# Patient Record
Sex: Male | Born: 1970
Health system: Southern US, Community
[De-identification: ages and names within clinical notes are randomized; demographics above are authoritative.]

## PROBLEM LIST (undated history)

## (undated) DIAGNOSIS — R079 Chest pain, unspecified: Secondary | ICD-10-CM

## (undated) DIAGNOSIS — E559 Vitamin D deficiency, unspecified: Secondary | ICD-10-CM

## (undated) DIAGNOSIS — M25549 Pain in joints of unspecified hand: Secondary | ICD-10-CM

## (undated) DIAGNOSIS — E7800 Pure hypercholesterolemia, unspecified: Secondary | ICD-10-CM

## (undated) DIAGNOSIS — K219 Gastro-esophageal reflux disease without esophagitis: Secondary | ICD-10-CM

## (undated) DIAGNOSIS — N200 Calculus of kidney: Secondary | ICD-10-CM

## (undated) DIAGNOSIS — I10 Essential (primary) hypertension: Secondary | ICD-10-CM

## (undated) HISTORY — DX: Essential (primary) hypertension: I10

## (undated) HISTORY — DX: Chest pain, unspecified: R07.9

## (undated) HISTORY — DX: Pure hypercholesterolemia, unspecified: E78.00

## (undated) HISTORY — DX: Calculus of kidney: N20.0

## (undated) HISTORY — DX: Pain in joints of unspecified hand: M25.549

## (undated) HISTORY — DX: Gastro-esophageal reflux disease without esophagitis: K21.9

## (undated) HISTORY — PX: TONSILLECTOMY: SUR1361

---
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
## 2005-12-31 ENCOUNTER — Emergency Department (HOSPITAL_COMMUNITY): Admission: EM | Admit: 2005-12-31 | Discharge: 2005-12-31 | Payer: Self-pay | Admitting: *Deleted

## 2006-04-09 ENCOUNTER — Emergency Department (HOSPITAL_COMMUNITY): Admission: EM | Admit: 2006-04-09 | Discharge: 2006-04-09 | Payer: Self-pay | Admitting: Emergency Medicine

## 2006-12-10 ENCOUNTER — Emergency Department (HOSPITAL_COMMUNITY): Admission: EM | Admit: 2006-12-10 | Discharge: 2006-12-10 | Payer: Self-pay | Admitting: Emergency Medicine

## 2007-07-22 ENCOUNTER — Emergency Department (HOSPITAL_COMMUNITY): Admission: EM | Admit: 2007-07-22 | Discharge: 2007-07-22 | Payer: Self-pay | Admitting: Emergency Medicine

## 2008-01-26 ENCOUNTER — Ambulatory Visit (HOSPITAL_BASED_OUTPATIENT_CLINIC_OR_DEPARTMENT_OTHER): Admission: RE | Admit: 2008-01-26 | Discharge: 2008-01-26 | Payer: Self-pay | Admitting: Urology

## 2009-01-01 ENCOUNTER — Emergency Department (HOSPITAL_COMMUNITY): Admission: EM | Admit: 2009-01-01 | Discharge: 2009-01-01 | Payer: Self-pay | Admitting: Emergency Medicine

## 2009-02-23 ENCOUNTER — Encounter: Admission: RE | Admit: 2009-02-23 | Discharge: 2009-02-23 | Payer: Self-pay | Admitting: Family Medicine

## 2009-05-24 ENCOUNTER — Encounter: Admission: RE | Admit: 2009-05-24 | Discharge: 2009-05-24 | Payer: Self-pay | Admitting: Family Medicine

## 2009-07-21 ENCOUNTER — Emergency Department (HOSPITAL_COMMUNITY): Admission: EM | Admit: 2009-07-21 | Discharge: 2009-07-22 | Payer: Self-pay | Admitting: Emergency Medicine

## 2010-07-14 ENCOUNTER — Emergency Department (HOSPITAL_COMMUNITY)
Admission: EM | Admit: 2010-07-14 | Discharge: 2010-07-14 | Payer: Self-pay | Source: Home / Self Care | Admitting: Family Medicine

## 2010-08-16 ENCOUNTER — Emergency Department (HOSPITAL_COMMUNITY)
Admission: EM | Admit: 2010-08-16 | Discharge: 2010-08-17 | Payer: Self-pay | Source: Home / Self Care | Admitting: Emergency Medicine

## 2010-08-25 ENCOUNTER — Emergency Department (INDEPENDENT_AMBULATORY_CARE_PROVIDER_SITE_OTHER)
Admission: EM | Admit: 2010-08-25 | Discharge: 2010-08-25 | Payer: Self-pay | Source: Home / Self Care | Admitting: Emergency Medicine

## 2010-08-25 DIAGNOSIS — R220 Localized swelling, mass and lump, head: Secondary | ICD-10-CM

## 2010-08-25 DIAGNOSIS — S0003XA Contusion of scalp, initial encounter: Secondary | ICD-10-CM

## 2010-10-29 LAB — POCT I-STAT, CHEM 8
BUN: 11 mg/dL (ref 6–23)
Chloride: 108 mEq/L (ref 96–112)
Creatinine, Ser: 1 mg/dL (ref 0.4–1.5)
HCT: 39 % (ref 39.0–52.0)
Hemoglobin: 13.3 g/dL (ref 13.0–17.0)
Potassium: 4 mEq/L (ref 3.5–5.1)
Sodium: 144 mEq/L (ref 135–145)
TCO2: 27 mmol/L (ref 0–100)

## 2010-12-11 NOTE — Op Note (Signed)
NAME:  Jacob Freeman, Jacob Freeman                ACCOUNT NO.:  [id number]   MEDICAL RECORD NO.:  [id number]          PATIENT TYPE:  AMB   LOCATION:  [facility]                         FACILITY:  [facility] ([place])   PHYSICIAN:  Martina Sinner, MD DATE OF BIRTH:  May 20, 1971   DATE OF PROCEDURE:  01/26/2008  DATE OF DISCHARGE:                               OPERATIVE REPORT   PREOPERATIVE DIAGNOSES:  1. Urethral stricture.  2. Microscopic hematuria.   POSTOPERATIVE DIAGNOSES:  1. Urethral stricture.  2. Microscopic hematuria.  3. Mild meatal stenosis.   SURGEON:  Dr. Jonna Coup MacDiarmid.   INDICATIONS FOR PROCEDURE:  Mr. Derrig had microscopic hematuria and we  could not pass the scope beyond the sphincter in the office, in spite of  him being very compliant.   DESCRIPTION OF PROCEDURE:  Today I was to cystoscope him under  anesthesia.  I could not insert the 21-French scope because of mild  meatal stenosis.  With male sounds, he was gently dilated from 67-  [place] to 24-French.  The scope was easily inserted.  The penile bulbar  urethra was normal.  There was a lot of firmness to the membranous  urethra and I had to negotiate the scope upwards and then through the  prostatic urethra into the bladder neck, over a high-riding bladder  neck.  His bladder mucosa and trigone were normal.  There is no stitch,  foreign body or carcinoma.  I re-inspected his urethra a number of  times.  He had minimal bilobar enlargement of the prostate.  He had a  little bit of rigid high-riding bladder neck.  In my opinion he did have  a little bit of scar tissue in the membranous urethra with a typical  shaggy appearance that had been dilated from passage of the scope.   In my opinion, Mr. Mcpheeters had a relatively asymptomatic mild membranous  urethral stricture.  He is denying microscopic hematuria.  He will be  seen on a p.r.n. basis.           ______________________________  Martina Sinner, MD  Electronically  Signed     SAM/MEDQ  D:  01/26/2008  T:  01/26/2008  Job:  [id number]

## 2011-01-23 ENCOUNTER — Encounter: Payer: Self-pay | Admitting: Gastroenterology

## 2011-02-11 ENCOUNTER — Emergency Department (HOSPITAL_COMMUNITY)
Admission: EM | Admit: 2011-02-11 | Discharge: 2011-02-12 | Disposition: A | Discharge status: Home / Self Care | Payer: Managed Care, Other (non HMO) | Attending: Emergency Medicine | Admitting: Emergency Medicine

## 2011-02-11 ENCOUNTER — Emergency Department (HOSPITAL_COMMUNITY): Payer: Managed Care, Other (non HMO)

## 2011-02-11 DIAGNOSIS — E7800 Pure hypercholesterolemia, unspecified: Secondary | ICD-10-CM | POA: Insufficient documentation

## 2011-02-11 DIAGNOSIS — I10 Essential (primary) hypertension: Secondary | ICD-10-CM | POA: Insufficient documentation

## 2011-02-11 DIAGNOSIS — J189 Pneumonia, unspecified organism: Secondary | ICD-10-CM | POA: Insufficient documentation

## 2011-02-11 DIAGNOSIS — R079 Chest pain, unspecified: Secondary | ICD-10-CM | POA: Insufficient documentation

## 2011-02-11 LAB — COMPREHENSIVE METABOLIC PANEL
ALT: 19 U/L (ref 0–53)
BUN: 12 mg/dL (ref 6–23)
Calcium: 9.6 mg/dL (ref 8.4–10.5)
Potassium: 3.5 mEq/L (ref 3.5–5.1)
Sodium: 137 mEq/L (ref 135–145)
Total Bilirubin: 0.7 mg/dL (ref 0.3–1.2)
Total Protein: 8 g/dL (ref 6.0–8.3)

## 2011-02-11 LAB — DIFFERENTIAL
Basophils Absolute: 0 10*3/uL (ref 0.0–0.1)
Basophils Relative: 1 % (ref 0–1)
Eosinophils Absolute: 0.1 10*3/uL (ref 0.0–0.7)
Eosinophils Relative: 2 % (ref 0–5)
Neutro Abs: 3.6 10*3/uL (ref 1.7–7.7)

## 2011-02-11 LAB — CK TOTAL AND CKMB (NOT AT ARMC): CK, MB: 0.6 ng/mL (ref 0.3–4.0)

## 2011-02-11 LAB — CBC
Hemoglobin: 14 g/dL (ref 13.0–17.0)
MCHC: 31.9 g/dL (ref 30.0–36.0)
MCV: 79.4 fL (ref 78.0–100.0)
Platelets: 218 10*3/uL (ref 150–400)
RBC: 5.53 MIL/uL (ref 4.22–5.81)
RDW: 14 % (ref 11.5–15.5)

## 2011-03-12 ENCOUNTER — Ambulatory Visit: Payer: Self-pay | Admitting: Gastroenterology

## 2011-04-25 LAB — POCT HEMOGLOBIN-HEMACUE: Hemoglobin: 14.4

## 2011-05-03 LAB — URINALYSIS, ROUTINE W REFLEX MICROSCOPIC
Protein, ur: 100 — AB
Specific Gravity, Urine: 1.02

## 2011-05-03 LAB — URINE MICROSCOPIC-ADD ON

## 2012-04-07 ENCOUNTER — Emergency Department (INDEPENDENT_AMBULATORY_CARE_PROVIDER_SITE_OTHER)
Admission: EM | Admit: 2012-04-07 | Discharge: 2012-04-07 | Disposition: A | Discharge status: Home / Self Care | Payer: Self-pay | Source: Home / Self Care | Attending: Family Medicine | Admitting: Family Medicine

## 2012-04-07 ENCOUNTER — Encounter (HOSPITAL_COMMUNITY): Payer: Self-pay

## 2012-04-07 DIAGNOSIS — R0789 Other chest pain: Secondary | ICD-10-CM

## 2012-04-07 DIAGNOSIS — I10 Essential (primary) hypertension: Secondary | ICD-10-CM

## 2012-04-07 MED ORDER — IBUPROFEN 800 MG PO TABS: 800.0000 mg | ORAL_TABLET | Freq: Three times a day (TID) | ORAL | Status: AC

## 2012-04-07 MED ORDER — LISINOPRIL 40 MG PO TABS: 40.0000 mg | ORAL_TABLET | Freq: Every day | ORAL | Status: DC

## 2012-04-07 MED ORDER — IBUPROFEN 800 MG PO TABS: 800.0000 mg | ORAL_TABLET | Freq: Three times a day (TID) | ORAL | Status: DC

## 2012-04-07 NOTE — ED Notes (Signed)
C/o lt side chest pain that he describes as "sticking" and intermittent, headaches and intermittent dizziness.  States sx for a couple of months but worse in the last 2 days.  Has not taken BP meds in more than 3 months.  States chest pain is worse with certain movements such as driving  that it is painful to palpation and hurts to lie on his lt side.  Denies SOB, n/v or sweating.

## 2012-04-07 NOTE — ED Provider Notes (Signed)
History     CSN: [id number]  Arrival date & time 04/07/12  8295   First MD Initiated Contact with Patient 04/07/12 [phone]      Chief Complaint  Patient presents with  . Hypertension  . Chest Pain    (Consider location/radiation/quality/duration/timing/severity/associated sxs/prior treatment) Patient is a 41 y.o. male presenting with hypertension and chest pain. The history is provided by the patient and the spouse.  Hypertension This is a new problem. The current episode started more than 1 week ago. The problem has been gradually worsening. Associated symptoms include chest pain. Pertinent negatives include no abdominal pain and no shortness of breath.  Chest Pain The chest pain began more  than 1 month ago. Chest pain occurs intermittently. The pain is associated with lifting. The pain is currently at 1/10. The severity of the pain is mild. The quality of the pain is described as sharp and similar to previous episodes. The pain radiates to the left shoulder. Primary symptoms include palpitations. Pertinent negatives for primary symptoms include no fever, no shortness of breath, no cough, no abdominal pain, no nausea and no vomiting.  The palpitations did not occur with shortness of breath.   His past medical history is significant for hypertension.     Past Medical History  Diagnosis Date  . Esophageal reflux   . Hypertension   . Hypercholesteremia   . Nephrolithiasis   . Pain in joint, hand   . Chest pain, unspecified   . Hematuria     Past Surgical History  Procedure Date  . Tonsillectomy     Family History  Problem Relation Age of Onset  . Brain cancer Father   . Hypertension Mother     History  Substance Use Topics  . Smoking status: Never Smoker   . Smokeless tobacco: Not on file  . Alcohol Use: No      Review of Systems  Constitutional: Negative.  Negative for fever.  HENT: Negative.   Respiratory: Negative for cough, chest tightness and shortness of  breath.   Cardiovascular: Positive for chest pain and palpitations. Negative for leg swelling.  Gastrointestinal: Negative.  Negative for nausea, vomiting and abdominal pain.    Allergies  Review of patient's allergies indicates no known allergies.  Home Medications   Current Outpatient Rx  Name Route Sig Dispense Refill  . AMLODIPINE BESYLATE 10 MG PO TABS Oral Take 10 mg by mouth daily.      . ATORVASTATIN CALCIUM 40 MG PO TABS Oral Take 40 mg by mouth daily.      . IBUPROFEN 800 MG PO TABS Oral Take 1 tablet (800 mg total) by mouth 3 (three) times daily. 30 tablet 0  . LISINOPRIL 40 MG PO TABS Oral Take 40 mg by mouth daily.      Marland Kitchen OMEPRAZOLE 40 MG PO CPDR Oral Take 40 mg by mouth daily.        BP 144/101  Pulse 75  Temp 97.9 F (36.6 C) (Oral)  Resp 16  SpO2 100%  Physical Exam  Constitutional: He is oriented to person, place, and time. He appears well-developed and well-nourished.  Neck: Normal range of motion. Neck supple.  Cardiovascular: Normal rate, regular rhythm, normal heart sounds and intact distal pulses.   Pulmonary/Chest: Breath sounds normal. He has no wheezes. He has no rales. He exhibits tenderness.       Local rerproducible soreness to left ant chest and ant left shoulder to palpation and rom.  Neurological: He is  alert and oriented to person, place, and time.  Skin: Skin is warm and dry.    ED Course  Procedures (including critical care time)  Labs Reviewed - No data to display No results found.   1. Musculoskeletal chest pain   2. Hypertension       MDM          Linna Hoff, MD 04/11/12 1055

## 2012-05-23 ENCOUNTER — Emergency Department (HOSPITAL_COMMUNITY)
Admission: EM | Admit: 2012-05-23 | Discharge: 2012-05-23 | Disposition: A | Discharge status: Home / Self Care | Payer: Medicaid Other | Attending: Emergency Medicine | Admitting: Emergency Medicine

## 2012-05-23 ENCOUNTER — Encounter (HOSPITAL_COMMUNITY): Payer: Self-pay | Admitting: Emergency Medicine

## 2012-05-23 DIAGNOSIS — Z79899 Other long term (current) drug therapy: Secondary | ICD-10-CM | POA: Insufficient documentation

## 2012-05-23 DIAGNOSIS — K219 Gastro-esophageal reflux disease without esophagitis: Secondary | ICD-10-CM | POA: Insufficient documentation

## 2012-05-23 DIAGNOSIS — I10 Essential (primary) hypertension: Secondary | ICD-10-CM | POA: Insufficient documentation

## 2012-05-23 DIAGNOSIS — E7800 Pure hypercholesterolemia, unspecified: Secondary | ICD-10-CM | POA: Insufficient documentation

## 2012-05-23 DIAGNOSIS — Z8679 Personal history of other diseases of the circulatory system: Secondary | ICD-10-CM | POA: Insufficient documentation

## 2012-05-23 DIAGNOSIS — R220 Localized swelling, mass and lump, head: Secondary | ICD-10-CM | POA: Insufficient documentation

## 2012-05-23 DIAGNOSIS — K0889 Other specified disorders of teeth and supporting structures: Secondary | ICD-10-CM

## 2012-05-23 DIAGNOSIS — Z87442 Personal history of urinary calculi: Secondary | ICD-10-CM | POA: Insufficient documentation

## 2012-05-23 DIAGNOSIS — K089 Disorder of teeth and supporting structures, unspecified: Secondary | ICD-10-CM | POA: Insufficient documentation

## 2012-05-23 DIAGNOSIS — Z8739 Personal history of other diseases of the musculoskeletal system and connective tissue: Secondary | ICD-10-CM | POA: Insufficient documentation

## 2012-05-23 MED ORDER — IBUPROFEN 600 MG PO TABS: 600.0000 mg | ORAL_TABLET | Freq: Four times a day (QID) | ORAL | Status: DC | PRN

## 2012-05-23 MED ORDER — PENICILLIN V POTASSIUM 500 MG PO TABS: 500.0000 mg | ORAL_TABLET | Freq: Four times a day (QID) | ORAL | Status: AC

## 2012-05-23 MED ORDER — HYDROCODONE-ACETAMINOPHEN 5-500 MG PO TABS: 1.0000 | ORAL_TABLET | Freq: Four times a day (QID) | ORAL | Status: DC | PRN

## 2012-05-23 NOTE — ED Notes (Signed)
Pt presents today with R side jaw swelling,onset was Thursday. Pt reports that he has a "bad tooth" on R lower jaw that is tender to bite on. Pt describes jaw pain as " like he has been punched in face." Pt jaw is visibly swollen

## 2012-05-23 NOTE — ED Provider Notes (Signed)
History     CSN: [id number]  Arrival date & time 05/23/12  4098   First MD Initiated Contact with Patient 05/23/12 [phone]      Chief Complaint  Patient presents with  . Dental Pain  . Facial Swelling    (Consider location/radiation/quality/duration/timing/severity/associated sxs/prior treatment) Patient is a 41 y.o. male presenting with tooth pain. The history is provided by the patient. No language interpreter was used.  Dental PainThe primary symptoms include mouth pain. Primary symptoms do not include dental injury, oral bleeding, fever, shortness of breath or sore throat. The symptoms began 2 days ago. The symptoms are worsening. The symptoms occur constantly.  Additional symptoms include: dental sensitivity to temperature, gum swelling, gum tenderness and facial swelling. Additional symptoms do not include: purulent gums, trismus, jaw pain, trouble swallowing, pain with swallowing, excessive salivation and ear pain. Medical issues do not include: alcohol problem, smoking and periodontal disease.    Past Medical History  Diagnosis Date  . Esophageal reflux   . Hypertension   . Hypercholesteremia   . Nephrolithiasis   . Pain in joint, hand   . Chest pain, unspecified   . Hematuria     Past Surgical History  Procedure Date  . Tonsillectomy     Family History  Problem Relation Age of Onset  . Brain cancer Father   . Hypertension Mother     History  Substance Use Topics  . Smoking status: Never Smoker   . Smokeless tobacco: Not on file  . Alcohol Use: No      Review of Systems  Constitutional: Negative.  Negative for fever.  HENT: Positive for facial swelling and dental problem. Negative for ear pain, sore throat and trouble swallowing.   Eyes: Negative.   Respiratory: Negative.  Negative for shortness of breath.   Cardiovascular: Negative.   Gastrointestinal: Negative.   Neurological: Negative.   Psychiatric/Behavioral: Negative.   All other systems  reviewed and are negative.    Allergies  Review of patient's allergies indicates no known allergies.  Home Medications   Current Outpatient Rx  Name Route Sig Dispense Refill  . AMLODIPINE BESYLATE 10 MG PO TABS Oral Take 10 mg by mouth daily.      . ATORVASTATIN CALCIUM 40 MG PO TABS Oral Take 40 mg by mouth daily.      Marland Kitchen LISINOPRIL 40 MG PO TABS Oral Take 40 mg by mouth daily.      Marland Kitchen OMEPRAZOLE 40 MG PO CPDR Oral Take 40 mg by mouth daily.        BP 135/100  Pulse 83  Temp 98.5 F (36.9 C) (Oral)  Resp 16  SpO2 99%  Physical Exam  HENT:  Head: Normocephalic. No trismus in the jaw.  Right Ear: Tympanic membrane normal.  Left Ear: Tympanic membrane normal.  Nose: Nose normal.  Mouth/Throat: Uvula is midline and mucous membranes are normal. Abnormal dentition. Dental caries present. No dental abscesses. No oropharyngeal exudate.    ED Course  Procedures (including critical care time)  Labs Reviewed - No data to display No results found.   No diagnosis found.    MDM  Dental pain/decay x 2 days with gum swelling. Follow up with dentist on call Monday. Start penicillin today.  Vicodin and ibuprofen for pain.  Return to ER for fever, trismus or severe pain.        Remi Haggard, NP 05/23/12 2234

## 2012-05-24 NOTE — ED Provider Notes (Signed)
Medical screening examination/treatment/procedure(s) were performed by non-physician practitioner and as supervising physician I was immediately available for consultation/collaboration.  Jones Skene, M.D.     Jones Skene, MD 05/24/12 [phone]

## 2014-01-20 ENCOUNTER — Emergency Department (HOSPITAL_COMMUNITY): Payer: Medicaid Other

## 2014-01-20 ENCOUNTER — Emergency Department (HOSPITAL_COMMUNITY)
Admission: EM | Admit: 2014-01-20 | Discharge: 2014-01-20 | Disposition: A | Discharge status: Home / Self Care | Payer: Medicaid Other | Attending: Emergency Medicine | Admitting: Emergency Medicine

## 2014-01-20 ENCOUNTER — Encounter (HOSPITAL_COMMUNITY): Payer: Self-pay | Admitting: Emergency Medicine

## 2014-01-20 DIAGNOSIS — N200 Calculus of kidney: Secondary | ICD-10-CM | POA: Insufficient documentation

## 2014-01-20 DIAGNOSIS — E7800 Pure hypercholesterolemia, unspecified: Secondary | ICD-10-CM | POA: Insufficient documentation

## 2014-01-20 DIAGNOSIS — I10 Essential (primary) hypertension: Secondary | ICD-10-CM | POA: Insufficient documentation

## 2014-01-20 DIAGNOSIS — Z8719 Personal history of other diseases of the digestive system: Secondary | ICD-10-CM | POA: Insufficient documentation

## 2014-01-20 DIAGNOSIS — Z79899 Other long term (current) drug therapy: Secondary | ICD-10-CM | POA: Insufficient documentation

## 2014-01-20 LAB — URINE MICROSCOPIC-ADD ON

## 2014-01-20 LAB — URINALYSIS, ROUTINE W REFLEX MICROSCOPIC
BILIRUBIN URINE: NEGATIVE
Glucose, UA: NEGATIVE mg/dL
KETONES UR: NEGATIVE mg/dL
LEUKOCYTES UA: NEGATIVE
NITRITE: NEGATIVE
Protein, ur: NEGATIVE mg/dL
SPECIFIC GRAVITY, URINE: 1.008 (ref 1.005–1.030)
UROBILINOGEN UA: 1 mg/dL (ref 0.0–1.0)
pH: 6.5 (ref 5.0–8.0)

## 2014-01-20 LAB — CBC
HCT: 40.3 % (ref 39.0–52.0)
Hemoglobin: 13.2 g/dL (ref 13.0–17.0)
MCH: 26.7 pg (ref 26.0–34.0)
MCHC: 32.8 g/dL (ref 30.0–36.0)
MCV: 81.4 fL (ref 78.0–100.0)
PLATELETS: 191 10*3/uL (ref 150–400)
RBC: 4.95 MIL/uL (ref 4.22–5.81)
RDW: 14.2 % (ref 11.5–15.5)
WBC: 4.8 10*3/uL (ref 4.0–10.5)

## 2014-01-20 LAB — BASIC METABOLIC PANEL
BUN: 11 mg/dL (ref 6–23)
CALCIUM: 9 mg/dL (ref 8.4–10.5)
CO2: 25 meq/L (ref 19–32)
CREATININE: 1.28 mg/dL (ref 0.50–1.35)
Chloride: 106 mEq/L (ref 96–112)
GFR, EST AFRICAN AMERICAN: 78 mL/min — AB (ref >90)
GFR, EST NON AFRICAN AMERICAN: 67 mL/min — AB (ref >90)
Glucose, Bld: 100 mg/dL — ABNORMAL HIGH (ref 70–99)
Potassium: 3.8 mEq/L (ref 3.7–5.3)
SODIUM: 144 meq/L (ref 137–147)

## 2014-01-20 MED ORDER — SODIUM CHLORIDE 0.9 % IV BOLUS (SEPSIS)
500.0000 mL | Freq: Once | INTRAVENOUS | Status: AC
  Administered 2014-01-20: 500 mL via INTRAVENOUS

## 2014-01-20 MED ORDER — OXYCODONE-ACETAMINOPHEN 5-325 MG PO TABS
2.0000 | ORAL_TABLET | ORAL | Status: DC | PRN
Start: 2014-01-20 — End: 2015-12-18

## 2014-01-20 MED ORDER — MORPHINE SULFATE 4 MG/ML IJ SOLN
4.0000 mg | Freq: Once | INTRAMUSCULAR | Status: AC
  Administered 2014-01-20: 4 mg via INTRAVENOUS
  Filled 2014-01-20: qty 1

## 2014-01-20 MED ORDER — PROMETHAZINE HCL 25 MG PO TABS: 25.0000 mg | ORAL_TABLET | Freq: Four times a day (QID) | ORAL | Status: DC | PRN

## 2014-01-20 MED ORDER — KETOROLAC TROMETHAMINE 30 MG/ML IJ SOLN
30.0000 mg | Freq: Once | INTRAMUSCULAR | Status: AC
  Administered 2014-01-20: 30 mg via INTRAVENOUS
  Filled 2014-01-20: qty 1

## 2014-01-20 MED ORDER — ONDANSETRON HCL 4 MG/2ML IJ SOLN
4.0000 mg | Freq: Once | INTRAMUSCULAR | Status: AC
  Administered 2014-01-20: 4 mg via INTRAVENOUS
  Filled 2014-01-20: qty 2

## 2014-01-20 NOTE — ED Provider Notes (Signed)
CSN: [id number]     Arrival date & time 01/20/14  0910 History   First MD Initiated Contact with Patient 01/20/14 0920     Chief Complaint  Patient presents with  . Flank Pain     (Consider location/radiation/quality/duration/timing/severity/associated sxs/prior Treatment) HPI.... right flank pain radiating to right lower abdomen described as burning since last night with associated dysuria and decreased urinary output. Patient has a history of 3-4 kidney stones in the past 5 years. Severity is moderate. Nothing makes symptoms better or worse. No obvious hematuria  Past Medical History  Diagnosis Date  . Esophageal reflux   . Hypertension   . Hypercholesteremia   . Nephrolithiasis   . Pain in joint, hand   . Chest pain, unspecified   . Hematuria    Past Surgical History  Procedure Laterality Date  . Tonsillectomy     Family History  Problem Relation Age of Onset  . Brain cancer Father   . Hypertension Mother    History  Substance Use Topics  . Smoking status: Never Smoker   . Smokeless tobacco: Not on file  . Alcohol Use: No    Review of Systems  All other systems reviewed and are negative.     Allergies  Review of patient's allergies indicates no known allergies.  Home Medications   Prior to Admission medications   Medication Sig Start Date End Date Taking? Authorizing Dayana Dalporto  amLODipine (NORVASC) 10 MG tablet Take 10 mg by mouth daily.   Yes Historical Delmer Kowalski, MD  atorvastatin (LIPITOR) 40 MG tablet Take 40 mg by mouth daily.   Yes Historical Mavric Cortright, MD  lisinopril (PRINIVIL,ZESTRIL) 40 MG tablet Take 40 mg by mouth every morning.    Yes Historical Falcon Mccaskey, MD  oxyCODONE-acetaminophen (PERCOCET) 5-325 MG per tablet Take 2 tablets by mouth every 4 (four) hours as needed. 01/20/14   Donnetta HutchingBrian Cook, MD  promethazine (PHENERGAN) 25 MG tablet Take 1 tablet (25 mg total) by mouth every 6 (six) hours as needed for nausea. 01/20/14   Donnetta HutchingBrian Cook, MD   BP 127/83   Pulse 73  Temp(Src) 98.7 F (37.1 C) (Oral)  Resp 18  SpO2 98% Physical Exam  Nursing note and vitals reviewed. Constitutional: He is oriented to person, place, and time. He appears well-developed and well-nourished.  HENT:  Head: Normocephalic and atraumatic.  Eyes: Conjunctivae and EOM are normal. Pupils are equal, round, and reactive to light.  Neck: Normal range of motion. Neck supple.  Cardiovascular: Normal rate, regular rhythm and normal heart sounds.   Pulmonary/Chest: Effort normal and breath sounds normal.  Abdominal: Soft. Bowel sounds are normal.  Genitourinary:  Minimal right flank tenderness  Musculoskeletal: Normal range of motion.  Neurological: He is alert and oriented to person, place, and time.  Skin: Skin is warm and dry.  Psychiatric: He has a normal mood and affect. His behavior is normal.    ED Course  Procedures (including critical care time) Labs Review Labs Reviewed  BASIC METABOLIC PANEL - Abnormal; Notable for the following:    Glucose, Bld 100 (*)    GFR calc non Af Amer 67 (*)    GFR calc Af Amer 78 (*)    All other components within normal limits  URINALYSIS, ROUTINE W REFLEX MICROSCOPIC - Abnormal; Notable for the following:    Hgb urine dipstick LARGE (*)    All other components within normal limits  CBC  URINE MICROSCOPIC-ADD ON    Imaging Review Ct Abdomen Pelvis Wo Contrast  01/20/2014   CLINICAL DATA:  History of stones with right flank pain  EXAM: CT ABDOMEN AND PELVIS WITHOUT CONTRAST  TECHNIQUE: Multidetector CT imaging of the abdomen and pelvis was performed following the standard protocol without IV contrast.  COMPARISON:  02/23/2009  FINDINGS: The lung bases are free of acute infiltrate or sizable effusion.  The liver, gallbladder, spleen, adrenal glands and pancreas are all normal in their CT appearance. The kidneys are well visualized bilaterally. No renal calculi are noted on the left. On the right there is a stable 5 mm  calcification similar to that seen on the prior exam. Fullness of the collecting system on the right is noted and this extends into the ureter. This fullness extends to the level of the ureterovesical junction and there is a 4 mm stone identified. This is best seen on image number 80 of series 2. No other obstructive changes are seen.  The appendix is within normal limits. The bladder is well distended. No pelvic mass lesion or sidewall abnormality is noted.  IMPRESSION: Stable 5 mm right renal calculus similar to that seen on the prior exam.  There is a new distal right ureteral stone at the UVJ causing mild obstructive changes. It measures 4 mm in dimension.   Electronically Signed   By: Alcide CleverMark  Lukens M.D.   On: 01/20/2014 11:51   Dg Abd 1 View  01/20/2014   CLINICAL DATA:  Abdominal pain, history of kidney stones  EXAM: ABDOMEN - 1 VIEW  COMPARISON:  02/23/2009  FINDINGS: Scattered large and small bowel gas is noted. A 5 mm calculus is noted overlying the lower pole of the right kidney similar to that seen on previous CT. Multiple calcifications are noted within the pelvis likely representing phleboliths.  IMPRESSION: Stable right renal stone. No definitive ureteral stones are seen although multiple phleboliths are noted within the pelvis. CT urogram may be helpful for further evaluation as necessary.   Electronically Signed   By: Alcide CleverMark  Lukens M.D.   On: 01/20/2014 10:38     EKG Interpretation None      MDM   Final diagnoses:  Right kidney stone    CT scan reveals a 4 mm distal right ureteral stone at the UVJ.  Pain is well-controlled.  Discharge medications Percocet and Phenergan 25 mg. Referral to urology     Donnetta HutchingBrian Cook, MD 01/20/14 1355

## 2014-01-20 NOTE — ED Notes (Signed)
MD at bedside. 

## 2014-01-20 NOTE — ED Notes (Signed)
Per pt, states right flank pain and burning with urination since yesterday-history of kidney stones

## 2014-01-20 NOTE — ED Notes (Signed)
Pt escorted to discharge window. Verbalized understanding discharge instructions. In no acute distress.   

## 2014-01-20 NOTE — Discharge Instructions (Signed)
Kidney Stones Kidney stones (urolithiasis) are solid masses that form inside your kidneys. The intense pain is caused by the stone moving through the kidney, ureter, bladder, and urethra (urinary tract). When the stone moves, the ureter starts to spasm around the stone. The stone is usually passed in your pee (urine).  HOME CARE  Drink enough fluids to keep your pee clear or pale yellow. This helps to get the stone out.  Strain all pee through the provided strainer. Do not pee without peeing through the strainer, not even once. If you pee the stone out, catch it in the strainer. The stone may be as small as a grain of salt. Take this to your doctor. This will help your doctor figure out what you can do to try to prevent more kidney stones.  Only take medicine as told by your doctor.  Follow up with your doctor as told.  Get follow-up X-rays as told by your doctor. GET HELP IF: You have pain that gets worse even if you have been taking pain medicine. GET HELP RIGHT AWAY IF:   Your pain does not get better with medicine.  You have a fever or shaking chills.  Your pain increases and gets worse over 18 hours.  You have new belly (abdominal) pain.  You feel faint or pass out.  You are unable to pee. MAKE SURE YOU:   Understand these instructions.  Will watch your condition.  Will get help right away if you are not doing well or get worse. Document Released: 01/01/2008 Document Revised: 03/17/2013 Document Reviewed: 12/16/2012 [facility] Patient Information 2015 [place], [place]. This information is not intended to replace advice given to you by your health care provider. Make sure you discuss any questions you have with your health care provider.   You have a small 4 mm kidney stone. Medication for pain and nausea. Followup with urologist if not improving

## 2014-01-20 NOTE — ED Notes (Signed)
Patient transported to CT 

## 2015-12-18 ENCOUNTER — Encounter (HOSPITAL_BASED_OUTPATIENT_CLINIC_OR_DEPARTMENT_OTHER): Payer: Self-pay | Admitting: *Deleted

## 2015-12-18 ENCOUNTER — Emergency Department (HOSPITAL_BASED_OUTPATIENT_CLINIC_OR_DEPARTMENT_OTHER): Payer: Medicaid Other

## 2015-12-18 DIAGNOSIS — I10 Essential (primary) hypertension: Secondary | ICD-10-CM | POA: Insufficient documentation

## 2015-12-18 DIAGNOSIS — Z79899 Other long term (current) drug therapy: Secondary | ICD-10-CM | POA: Insufficient documentation

## 2015-12-18 DIAGNOSIS — R590 Localized enlarged lymph nodes: Secondary | ICD-10-CM | POA: Insufficient documentation

## 2015-12-18 DIAGNOSIS — E782 Mixed hyperlipidemia: Secondary | ICD-10-CM | POA: Insufficient documentation

## 2015-12-18 NOTE — ED Notes (Signed)
Pt reports lump in the left side of his neck that he just discovered today.  Reports painful to touch.

## 2015-12-19 ENCOUNTER — Emergency Department (HOSPITAL_BASED_OUTPATIENT_CLINIC_OR_DEPARTMENT_OTHER)
Admission: EM | Admit: 2015-12-19 | Discharge: 2015-12-19 | Disposition: A | Discharge status: Home / Self Care | Payer: Medicaid Other | Attending: Emergency Medicine | Admitting: Emergency Medicine

## 2015-12-19 DIAGNOSIS — R590 Localized enlarged lymph nodes: Secondary | ICD-10-CM

## 2015-12-19 NOTE — ED Provider Notes (Signed)
CSN: [id number]     Arrival date & time 12/18/15  2318 History   First MD Initiated Contact with Patient 12/19/15 0302     Chief Complaint  Patient presents with  . Mass     (Consider location/radiation/quality/duration/timing/severity/associated sxs/prior Treatment) HPI  This is a 45 year old male who noticed a tender mass in his left anterior neck yesterday evening when he got off work. Pain is mild to moderate, worse with palpation. He denies fever, malaise, sore throat, mouth pain or face pain.  Past Medical History  Diagnosis Date  . Esophageal reflux   . Hypertension   . Hypercholesteremia   . Nephrolithiasis   . Pain in joint, hand   . Chest pain, unspecified   . Hematuria    Past Surgical History  Procedure Laterality Date  . Tonsillectomy     Family History  Problem Relation Age of Onset  . Brain cancer Father   . Hypertension Mother    Social History  Substance Use Topics  . Smoking status: Never Smoker   . Smokeless tobacco: None  . Alcohol Use: No    Review of Systems  All other systems reviewed and are negative.   Allergies  Review of patient's allergies indicates no known allergies.  Home Medications   Prior to Admission medications   Medication Sig Start Date End Date Taking? Authorizing Provider  amLODipine (NORVASC) 10 MG tablet Take 10 mg by mouth daily.    Historical Provider, MD  atorvastatin (LIPITOR) 40 MG tablet Take 40 mg by mouth daily.    Historical Provider, MD  lisinopril (PRINIVIL,ZESTRIL) 40 MG tablet Take 40 mg by mouth every morning.     Historical Provider, MD   BP 105/73 mmHg  Pulse 72  Temp(Src) 98.7 F (37.1 C) (Oral)  Resp 18  Ht 6' (1.829 m)  Wt 250 lb (113.399 kg)  BMI 33.90 kg/m2  SpO2 99%   Physical Exam  General: Well-developed, well-nourished male in no acute distress; appearance consistent with age of record HENT: normocephalic; atraumatic; no intraoral lesions seen; no masses, erythema or tenderness of  face; pharynx normal Eyes: pupils equal, round and reactive to light; extraocular muscles intact Neck: supple; tender, enlarged left jugulodigastric node; no posterior or right-sided lymphadenopathy Heart: regular rate and rhythm Lungs: clear to auscultation bilaterally Abdomen: soft; nondistended; nontender; bowel sounds present Extremities: No deformity; full range of motion; pulses normal Neurologic: Awake, alert and oriented; motor function intact in all extremities and symmetric; no facial droop Skin: Warm and dry Psychiatric: Normal mood and affect    ED Course  Procedures (including critical care time)   MDM  Nursing notes and vitals signs, including pulse oximetry, reviewed.  Summary of this visit's results, reviewed by myself:  Imaging Studies: Dg Neck Soft Tissue  12/19/2015  CLINICAL DATA:  Mass. Small left neck lump just below angle of mandible today, painful to touch. EXAM: NECK SOFT TISSUES - 1+ VIEW COMPARISON:  None. FINDINGS: There is no evidence of retropharyngeal soft tissue swelling or epiglottic enlargement. The cervical airway is unremarkable. No radio-opaque foreign body identified. No abnormal soft tissue air. Clinically evident mass is not seen radiographically. IMPRESSION: Clinically evident mass is not seen radiographically. Electronically Signed   By: Rubye OaksMelanie  Ehinger M.D.   On: 12/19/2015 01:27      Paula LibraJohn Harrington Jobe, MD 12/19/15 [phone]

## 2016-01-17 ENCOUNTER — Encounter (HOSPITAL_COMMUNITY): Payer: Self-pay | Admitting: *Deleted

## 2016-01-17 ENCOUNTER — Emergency Department (HOSPITAL_COMMUNITY): Payer: Worker's Compensation

## 2016-01-17 ENCOUNTER — Emergency Department (HOSPITAL_COMMUNITY)
Admission: EM | Admit: 2016-01-17 | Discharge: 2016-01-17 | Disposition: A | Discharge status: Home / Self Care | Payer: Worker's Compensation | Attending: Emergency Medicine | Admitting: Emergency Medicine

## 2016-01-17 DIAGNOSIS — Z79899 Other long term (current) drug therapy: Secondary | ICD-10-CM | POA: Diagnosis not present

## 2016-01-17 DIAGNOSIS — N50811 Right testicular pain: Secondary | ICD-10-CM

## 2016-01-17 DIAGNOSIS — Y990 Civilian activity done for income or pay: Secondary | ICD-10-CM | POA: Insufficient documentation

## 2016-01-17 DIAGNOSIS — S76219A Strain of adductor muscle, fascia and tendon of unspecified thigh, initial encounter: Secondary | ICD-10-CM

## 2016-01-17 DIAGNOSIS — N390 Urinary tract infection, site not specified: Secondary | ICD-10-CM | POA: Insufficient documentation

## 2016-01-17 DIAGNOSIS — R1030 Lower abdominal pain, unspecified: Secondary | ICD-10-CM | POA: Diagnosis present

## 2016-01-17 DIAGNOSIS — Y929 Unspecified place or not applicable: Secondary | ICD-10-CM | POA: Diagnosis not present

## 2016-01-17 DIAGNOSIS — X58XXXA Exposure to other specified factors, initial encounter: Secondary | ICD-10-CM | POA: Diagnosis not present

## 2016-01-17 DIAGNOSIS — Y9389 Activity, other specified: Secondary | ICD-10-CM | POA: Diagnosis not present

## 2016-01-17 DIAGNOSIS — S39011A Strain of muscle, fascia and tendon of abdomen, initial encounter: Secondary | ICD-10-CM | POA: Insufficient documentation

## 2016-01-17 DIAGNOSIS — I10 Essential (primary) hypertension: Secondary | ICD-10-CM | POA: Insufficient documentation

## 2016-01-17 LAB — URINALYSIS, ROUTINE W REFLEX MICROSCOPIC
Bilirubin Urine: NEGATIVE
Glucose, UA: NEGATIVE mg/dL
Ketones, ur: NEGATIVE mg/dL
Nitrite: NEGATIVE
Protein, ur: NEGATIVE mg/dL
Specific Gravity, Urine: 1.009 (ref 1.005–1.030)
pH: 7 (ref 5.0–8.0)

## 2016-01-17 LAB — URINE MICROSCOPIC-ADD ON

## 2016-01-17 MED ORDER — CEPHALEXIN 500 MG PO CAPS
500.0000 mg | ORAL_CAPSULE | Freq: Once | ORAL | Status: AC
  Administered 2016-01-17: 500 mg via ORAL
  Filled 2016-01-17: qty 1

## 2016-01-17 MED ORDER — CEPHALEXIN 500 MG PO CAPS: 500.0000 mg | ORAL_CAPSULE | Freq: Two times a day (BID) | ORAL | Status: DC

## 2016-01-17 MED ORDER — OXYCODONE-ACETAMINOPHEN 5-325 MG PO TABS
1.0000 | ORAL_TABLET | Freq: Once | ORAL | Status: AC
Start: 2016-01-17 — End: 2016-01-17
  Administered 2016-01-17: 1 via ORAL
  Filled 2016-01-17: qty 1

## 2016-01-17 NOTE — Discharge Instructions (Signed)
Please read and follow all provided instructions.  Your diagnoses today include:  1. Inguinal strain, initial encounter   2. Right testicular pain   3. UTI (lower urinary tract infection)    Tests performed today include:  Vital signs. See below for your results today.   Medications prescribed:   Take as prescribed   Home care instructions:  Follow any educational materials contained in this packet.  Follow-up instructions: Please follow-up with your primary care provider for further evaluation of symptoms and treatment   Return instructions:   Please return to the Emergency Department if you do not get better, if you get worse, or new symptoms OR  - Fever (temperature greater than 101.43F)  - Bleeding that does not stop with holding pressure to the area    -Severe pain (please note that you may be more sore the day after your accident)  - Chest Pain  - Difficulty breathing  - Severe nausea or vomiting  - Inability to tolerate food and liquids  - Passing out  - Skin becoming red around your wounds  - Change in mental status (confusion or lethargy)  - New numbness or weakness     Please return if you have any other emergent concerns.  Additional Information:  Your vital signs today were: BP 110/74 mmHg   Pulse 81   Temp(Src) 98.8 F (37.1 C) (Oral)   Resp 18   Ht 6' (1.829 m)   Wt 111.131 kg   BMI 33.22 kg/m2   SpO2 98% If your blood pressure (BP) was elevated above 135/85 this visit, please have this repeated by your doctor within one month. ---------------

## 2016-01-17 NOTE — ED Notes (Signed)
Ultrasound Bedside.

## 2016-01-17 NOTE — ED Provider Notes (Signed)
CSN: [id number]     Arrival date & time 01/17/16  1504 History   First MD Initiated Contact with Patient 01/17/16 1636     Chief Complaint  Patient presents with  . Testicle Pain   (Consider location/radiation/quality/duration/timing/severity/associated sxs/prior Treatment) HPI 45 y.o. male, presents to the Emergency Department today complaining of right inguinal pain and right testicular pain. Pt states that he was climbing into a truck this afternoon around 1400 States that is right leg was in the truck with his left on the ground. As he climbed in and put his knee down, he felt an immediate sharp pain in the right inguinal region that radiated towards his testicle. Pain is 7/10. Worse with movement. Sensation remains at rest. No N/V/D. No CP/SOB/ABD pain. No headaches. No hx of the same. No dysuria. No hematuria. No other symptoms noted.   Past Medical History  Diagnosis Date  . Esophageal reflux   . Hypertension   . Hypercholesteremia   . Nephrolithiasis   . Pain in joint, hand   . Chest pain, unspecified   . Hematuria    Past Surgical History  Procedure Laterality Date  . Tonsillectomy     Family History  Problem Relation Age of Onset  . Brain cancer Father   . Hypertension Mother    Social History  Substance Use Topics  . Smoking status: Never Smoker   . Smokeless tobacco: None  . Alcohol Use: No    Review of Systems ROS reviewed and all are negative for acute change except as noted in the HPI.  Allergies  Review of patient's allergies indicates no known allergies.  Home Medications   Prior to Admission medications   Medication Sig Start Date End Date Taking? Authorizing Provider  amLODipine (NORVASC) 10 MG tablet Take 10 mg by mouth daily.    Historical Provider, MD  atorvastatin (LIPITOR) 40 MG tablet Take 40 mg by mouth daily.    Historical Provider, MD  lisinopril (PRINIVIL,ZESTRIL) 40 MG tablet Take 40 mg by mouth every morning.     Historical Provider, MD    BP 110/74 mmHg  Pulse 81  Temp(Src) 98.8 F (37.1 C) (Oral)  Resp 18  SpO2 98%   Physical Exam  Constitutional: He is oriented to person, place, and time. He appears well-developed and well-nourished.  HENT:  Head: Normocephalic and atraumatic.  Eyes: EOM are normal. Pupils are equal, round, and reactive to light.  Neck: Normal range of motion. Neck supple.  Cardiovascular: Normal rate and regular rhythm.   Pulmonary/Chest: Effort normal.  Abdominal: Soft. Hernia confirmed negative in the right inguinal area and confirmed negative in the left inguinal area.  Genitourinary: Penis normal. Right testis shows tenderness. Right testis shows no swelling. Right testis is descended. Left testis shows no swelling and no tenderness. Left testis is descended. Circumcised. No phimosis, paraphimosis, hypospadias, penile erythema or penile tenderness. No discharge found.  TTP along right testicle. Mild relief with elevation. No erythema. No swelling noted. Chaperone present    Musculoskeletal: Normal range of motion.  Neurological: He is alert and oriented to person, place, and time.  Skin: Skin is warm and dry.  Psychiatric: He has a normal mood and affect. His behavior is normal. Thought content normal.  Nursing note and vitals reviewed.  ED Course  Procedures (including critical care time) Labs Review Labs Reviewed  URINALYSIS, ROUTINE W REFLEX MICROSCOPIC (NOT [facility]) - Abnormal; Notable for the following:    APPearance CLOUDY (*)  Hgb urine dipstick TRACE (*)    Leukocytes, UA MODERATE (*)    All other components within normal limits  URINE MICROSCOPIC-ADD ON - Abnormal; Notable for the following:    Squamous Epithelial / LPF 0-5 (*)    Bacteria, UA MANY (*)    All other components within normal limits  URINE CULTURE   Imaging Review US Scrotum  01/17/2016  CLINICAL DATA:  Right scrotal pain EXAM: SCROTAL ULTRASOUND DOPPLER ULTRASOUND OF THE TESTICLES TECHNIQUE: Complete  ultrasound examination of the testicles, epididymis, and other scrotal structures was performed. Color and spectral Doppler ultrasound were also utilized to evaluate blood flow to the testicles. COMPARISON:  None. FINDINGS: Right testicle Measurements: 3.7 x 2.6 x 2.9 cm. No mass or microlithiasis visualized. Left testicle Measurements: 3.7 x 2.6 x 2.9 cm. No mass or microlithiasis visualized. Right epididymis:  Normal in size and appearance. Left epididymis:  1.9 cm septated cyst or spermatocele. Hydrocele:  Small left hydrocele Varicocele:  Mild bilateral varicoceles. Pulsed Doppler interrogation of both testes demonstrates normal low resistance arterial and venous waveforms bilaterally. IMPRESSION: No evidence of testicular abnormality.  No torsion. Small left hydrocele.  Mild bilateral varicoceles. Electronically Signed   By: Charlett Nose M.D.   On: 01/17/2016 18:56   [place] Art/ven Flow Abd Pelv Doppler  01/17/2016  CLINICAL DATA:  Right scrotal pain EXAM: SCROTAL ULTRASOUND DOPPLER ULTRASOUND OF THE TESTICLES TECHNIQUE: Complete ultrasound examination of the testicles, epididymis, and other scrotal structures was performed. Color and spectral Doppler ultrasound were also utilized to evaluate blood flow to the testicles. COMPARISON:  None. FINDINGS: Right testicle Measurements: 3.7 x 2.6 x 2.9 cm. No mass or microlithiasis visualized. Left testicle Measurements: 3.7 x 2.6 x 2.9 cm. No mass or microlithiasis visualized. Right epididymis:  Normal in size and appearance. Left epididymis:  1.9 cm septated cyst or spermatocele. Hydrocele:  Small left hydrocele Varicocele:  Mild bilateral varicoceles. Pulsed Doppler interrogation of both testes demonstrates normal low resistance arterial and venous waveforms bilaterally. IMPRESSION: No evidence of testicular abnormality.  No torsion. Small left hydrocele.  Mild bilateral varicoceles. Electronically Signed   By: Charlett Nose M.D.   On: 01/17/2016 18:56   I have  personally reviewed and evaluated these images and lab results as part of my medical decision-making.   EKG Interpretation None      MDM  I have reviewed and evaluated the relevant laboratory values I have reviewed and evaluated the relevant imaging studies.  I have reviewed the relevant previous healthcare records. I obtained HPI from historian. Patient discussed with supervising physician  ED Course:  Assessment: Pt is a 45yM who presents with right testicle pain with onset around 1400 after climbing into trailer. Pain immediate. No relief. On exam, pt in NAD. Nontoxic/nonseptic appearing. VSS. Afebrile. Lungs CTA. Heart RRR. Abdomen nontender soft. Right testicle TTP. Mild relief with elevation. No swelling noted. UA shows UTI. Given keflex in ED. Concern for Torsion. Scrotal US unremarkable. No Torsion. No inguinal herniation on exam. Likely inguinal strain. Plan is to DC home with NSAIDs and follow up with PCP. Given Rx keflex. At time of discharge, Patient is in no acute distress. Vital Signs are stable. Patient is able to ambulate. Patient able to tolerate PO.    Disposition/Plan:  DC Home Additional Verbal discharge instructions given and discussed with patient.  Pt Instructed to f/u with PCP in the next week for evaluation and treatment of symptoms. Return precautions given Pt acknowledges and agrees with plan  Supervising Physician Raeford Razor, MD   Final diagnoses:  Inguinal strain, initial encounter  UTI (lower urinary tract infection)     Audry Pili, PA-C 01/17/16 1909  Raeford Razor, MD 01/19/16 2200

## 2016-01-17 NOTE — ED Notes (Signed)
Patient was working when he felt "a pinch" in his right scrotum @1400  today.  Patient denies scrotal swelling, but states scrotum is painful with movement.  Patient describes pain as sticking and throbbing.  Patient denies dysuria and hematuria.  Patient denies fever, N/V/D.  Patient rates pain 7/10.

## 2016-01-19 LAB — URINE CULTURE: Culture: NO GROWTH

## 2016-08-27 ENCOUNTER — Emergency Department (HOSPITAL_COMMUNITY)
Admission: EM | Admit: 2016-08-27 | Discharge: 2016-08-27 | Disposition: A | Discharge status: Home / Self Care | Payer: Managed Care, Other (non HMO) | Attending: Emergency Medicine | Admitting: Emergency Medicine

## 2016-08-27 ENCOUNTER — Emergency Department (HOSPITAL_COMMUNITY): Payer: Managed Care, Other (non HMO)

## 2016-08-27 ENCOUNTER — Encounter (HOSPITAL_COMMUNITY): Payer: Self-pay | Admitting: Emergency Medicine

## 2016-08-27 DIAGNOSIS — R1033 Periumbilical pain: Secondary | ICD-10-CM | POA: Diagnosis present

## 2016-08-27 DIAGNOSIS — Z79899 Other long term (current) drug therapy: Secondary | ICD-10-CM | POA: Diagnosis not present

## 2016-08-27 DIAGNOSIS — R112 Nausea with vomiting, unspecified: Secondary | ICD-10-CM

## 2016-08-27 DIAGNOSIS — I10 Essential (primary) hypertension: Secondary | ICD-10-CM | POA: Insufficient documentation

## 2016-08-27 DIAGNOSIS — K529 Noninfective gastroenteritis and colitis, unspecified: Secondary | ICD-10-CM

## 2016-08-27 DIAGNOSIS — R197 Diarrhea, unspecified: Secondary | ICD-10-CM

## 2016-08-27 LAB — URINALYSIS, ROUTINE W REFLEX MICROSCOPIC
Bacteria, UA: NONE SEEN
Bilirubin Urine: NEGATIVE
GLUCOSE, UA: NEGATIVE mg/dL
KETONES UR: NEGATIVE mg/dL
NITRITE: NEGATIVE
PH: 6 (ref 5.0–8.0)
Protein, ur: NEGATIVE mg/dL
Specific Gravity, Urine: 1.011 (ref 1.005–1.030)
Squamous Epithelial / LPF: NONE SEEN

## 2016-08-27 LAB — CBC
HCT: 41.3 % (ref 39.0–52.0)
HEMOGLOBIN: 13.1 g/dL (ref 13.0–17.0)
MCH: 25.6 pg — AB (ref 26.0–34.0)
MCHC: 31.7 g/dL (ref 30.0–36.0)
MCV: 80.8 fL (ref 78.0–100.0)
PLATELETS: 212 10*3/uL (ref 150–400)
RBC: 5.11 MIL/uL (ref 4.22–5.81)
RDW: 15.7 % — ABNORMAL HIGH (ref 11.5–15.5)
WBC: 6.2 10*3/uL (ref 4.0–10.5)

## 2016-08-27 LAB — COMPREHENSIVE METABOLIC PANEL
ALK PHOS: 66 U/L (ref 38–126)
ALT: 13 U/L — AB (ref 17–63)
ANION GAP: 4 — AB (ref 5–15)
AST: 12 U/L — ABNORMAL LOW (ref 15–41)
Albumin: 3.4 g/dL — ABNORMAL LOW (ref 3.5–5.0)
BILIRUBIN TOTAL: 0.8 mg/dL (ref 0.3–1.2)
BUN: 9 mg/dL (ref 6–20)
CALCIUM: 8.7 mg/dL — AB (ref 8.9–10.3)
CO2: 28 mmol/L (ref 22–32)
CREATININE: 1.32 mg/dL — AB (ref 0.61–1.24)
Chloride: 109 mmol/L (ref 101–111)
Glucose, Bld: 100 mg/dL — ABNORMAL HIGH (ref 65–99)
Potassium: 4.1 mmol/L (ref 3.5–5.1)
Sodium: 141 mmol/L (ref 135–145)
TOTAL PROTEIN: 6.7 g/dL (ref 6.5–8.1)

## 2016-08-27 LAB — LIPASE, BLOOD: Lipase: 16 U/L (ref 11–51)

## 2016-08-27 MED ORDER — IOPAMIDOL (ISOVUE-300) INJECTION 61%
INTRAVENOUS | Status: AC
  Administered 2016-08-27: 100 mL
  Filled 2016-08-27: qty 100

## 2016-08-27 MED ORDER — ONDANSETRON HCL 4 MG PO TABS: 4.0000 mg | ORAL_TABLET | Freq: Four times a day (QID) | ORAL | 0 refills | Status: DC

## 2016-08-27 MED ORDER — SODIUM CHLORIDE 0.9 % IV BOLUS (SEPSIS)
1000.0000 mL | Freq: Once | INTRAVENOUS | Status: AC
  Administered 2016-08-27: 1000 mL via INTRAVENOUS

## 2016-08-27 MED ORDER — IOPAMIDOL (ISOVUE-300) INJECTION 61%: 100.0000 mL | Freq: Once | INTRAVENOUS | Status: DC | PRN

## 2016-08-27 MED ORDER — KETOROLAC TROMETHAMINE 30 MG/ML IJ SOLN
30.0000 mg | Freq: Once | INTRAMUSCULAR | Status: AC
  Administered 2016-08-27: 30 mg via INTRAVENOUS
  Filled 2016-08-27: qty 1

## 2016-08-27 MED ORDER — ONDANSETRON HCL 4 MG/2ML IJ SOLN
4.0000 mg | Freq: Once | INTRAMUSCULAR | Status: AC
  Administered 2016-08-27: 4 mg via INTRAVENOUS
  Filled 2016-08-27: qty 2

## 2016-08-27 NOTE — ED Triage Notes (Signed)
Pt reports having abd pain, nausea, and diarrhea for the last 2 days. Pt reporting multiple episodes of diarrhea.

## 2016-08-27 NOTE — ED Provider Notes (Signed)
[facility]-EMERGENCY DEPT Provider Note   CSN: [id number] Arrival date & time: 08/27/16  0610     History   Chief Complaint Chief Complaint  Patient presents with  . Abdominal Pain    HPI Jacob Freeman is a 46 y.o. male with history of hypertension, nephrolithiasis, hematuria, esophageal reflux he presents with a 2 day history of nausea, vomiting, diarrhea, and abdominal pain. Patient reports he has been able to tolerate fluids and soup, but no solid foods. Even these cause him to have diarrhea immediately. Patient denies any bloody stools. Patient endorses a pressure, cramping periumbilical abdominal pain. Patient is concerned about his abdominal pain and that is not going away. Patient has had some associated back pain and describes as muscle soreness, worse on the right side. Patient has taken ibuprofen at home without relief. Patient denies any fevers, chest pain, shortness of breath, urinary symptoms.  HPI  Past Medical History:  Diagnosis Date  . Chest pain, unspecified   . Esophageal reflux   . Hematuria   . Hypercholesteremia   . Hypertension   . Nephrolithiasis   . Pain in joint, hand     There are no active problems to display for this patient.   Past Surgical History:  Procedure Laterality Date  . TONSILLECTOMY         Home Medications    Prior to Admission medications   Medication Sig Start Date End Date Taking? Authorizing Provider  acetaminophen (TYLENOL) 500 MG tablet Take 1,000 mg by mouth every 6 (six) hours as needed for moderate pain.   Yes Historical Provider, MD  amLODipine (NORVASC) 10 MG tablet Take 10 mg by mouth daily.   Yes Historical Provider, MD  atorvastatin (LIPITOR) 40 MG tablet Take 40 mg by mouth daily.   Yes Historical Provider, MD  hydrochlorothiazide (HYDRODIURIL) 12.5 MG tablet Take 12.5 mg by mouth daily. 01/01/16  Yes Historical Provider, MD  lisinopril (PRINIVIL,ZESTRIL) 40 MG tablet Take 40 mg by mouth every morning.    Yes Historical  Provider, MD  naproxen sodium (ANAPROX) 220 MG tablet Take 220 mg by mouth 2 (two) times daily with a meal.   Yes Historical Provider, MD  Pseudoephedrine-APAP-DM (DAYQUIL PO) Take 30 mLs by mouth every 8 (eight) hours as needed. For cold and flu symptoms   Yes Historical Provider, MD  cephALEXin (KEFLEX) 500 MG capsule Take 1 capsule (500 mg total) by mouth 2 (two) times daily. Patient not taking: Reported on 08/27/2016 01/17/16   Audry Piliyler Mohr, PA-C  ondansetron (ZOFRAN) 4 MG tablet Take 1 tablet (4 mg total) by mouth every 6 (six) hours. 08/27/16   Emi HolesAlexandra M Darlyn Repsher, PA-C    Family History Family History  Problem Relation Age of Onset  . Brain cancer Father   . Hypertension Mother     Social History Social History  Substance Use Topics  . Smoking status: Never Smoker  . Smokeless tobacco: Never Used  . Alcohol use No     Allergies   Patient has no known allergies.   Review of Systems Review of Systems  Constitutional: Negative for chills and fever.  HENT: Negative for facial swelling and sore throat.   Respiratory: Negative for shortness of breath.   Cardiovascular: Negative for chest pain.  Gastrointestinal: Positive for abdominal pain, diarrhea, nausea and vomiting. Negative for constipation.  Genitourinary: Negative for dysuria.  Musculoskeletal: Positive for back pain.  Skin: Negative for rash and wound.  Neurological: Negative for headaches.  Psychiatric/Behavioral: The patient is not  nervous/anxious.      Physical Exam Updated Vital Signs BP 120/79 (BP Location: Right Arm)   Pulse 65   Temp 98.2 F (36.8 C) (Oral)   Resp 20   Ht 6' (1.829 m)   Wt 108.9 kg   SpO2 99%   BMI 32.55 kg/m   Physical Exam  Constitutional: He appears well-developed and well-nourished. No distress.  HENT:  Head: Normocephalic and atraumatic.  Mouth/Throat: Oropharynx is clear and moist. No oropharyngeal exudate.  Eyes: Conjunctivae are normal. Pupils are equal, round, and reactive  to light. Right eye exhibits no discharge. Left eye exhibits no discharge. No scleral icterus.  Neck: Normal range of motion. Neck supple. No thyromegaly present.  Cardiovascular: Normal rate, regular rhythm, normal heart sounds and intact distal pulses.  Exam reveals no gallop and no friction rub.   No murmur heard. Pulmonary/Chest: Effort normal and breath sounds normal. No stridor. No respiratory distress. He has no wheezes. He has no rales.  Abdominal: Soft. Bowel sounds are normal. He exhibits no distension. There is tenderness in the periumbilical area. There is CVA tenderness (R sided). There is no rebound and no guarding.  Musculoskeletal: He exhibits no edema.  Lymphadenopathy:    He has no cervical adenopathy.  Neurological: He is alert. Coordination normal.  Skin: Skin is warm and dry. No rash noted. He is not diaphoretic. No pallor.  Psychiatric: He has a normal mood and affect.  Nursing note and vitals reviewed.    ED Treatments / Results  Labs (all labs ordered are listed, but only abnormal results are displayed) Labs Reviewed  COMPREHENSIVE METABOLIC PANEL - Abnormal; Notable for the following:       Result Value   Glucose, Bld 100 (*)    Creatinine, Ser 1.32 (*)    Calcium 8.7 (*)    Albumin 3.4 (*)    AST 12 (*)    ALT 13 (*)    Anion gap 4 (*)    All other components within normal limits  CBC - Abnormal; Notable for the following:    MCH 25.6 (*)    RDW 15.7 (*)    All other components within normal limits  URINALYSIS, ROUTINE W REFLEX MICROSCOPIC - Abnormal; Notable for the following:    Hgb urine dipstick MODERATE (*)    Leukocytes, UA SMALL (*)    All other components within normal limits  URINE CULTURE  LIPASE, BLOOD    EKG  EKG Interpretation None       Radiology Ct Abdomen Pelvis W Contrast  Result Date: 08/27/2016 CLINICAL DATA:  Periumbilical pain, nausea, vomiting, and diarrhea for the past 2 days. EXAM: CT ABDOMEN AND PELVIS WITH  CONTRAST TECHNIQUE: Multidetector CT imaging of the abdomen and pelvis was performed using the standard protocol following bolus administration of intravenous contrast. CONTRAST:  ISOVUE-300 IOPAMIDOL (ISOVUE-300) INJECTION 61% COMPARISON:  01/20/2014 FINDINGS: Lower chest: Subsegmental atelectasis is present in the right middle lobe. Mosaic attenuation in both lung bases may reflect a combination of motion artifact and potentially mild air trapping in the setting of expiratory phase imaging. Hepatobiliary: No focal liver abnormality is seen. No gallstones, gallbladder wall thickening, or biliary dilatation. Pancreas: Unremarkable. Spleen: Unremarkable. Adrenals/Urinary Tract: Unremarkable adrenal glands. Scattered areas of scarring are noted in both kidneys. A 1.8 cm low-density lesion in the interpolar right kidney is consistent with a cyst and stable to minimally larger than on the prior study. There is a nonobstructing 3 mm calculus in the right  lower pole. The bladder is unremarkable. Stomach/Bowel: The stomach is within normal limits. The appendix is unremarkable. There are multiple loops of fluid-filled small bowel in the central and left lower abdomen which demonstrate mild wall thickening and measure up to 3 cm in diameter. No bowel dilatation strongly suggestive of mechanical obstruction is identified. Scattered fluid is also present in the colon and rectum consistent with history of diarrheal illness. Vascular/Lymphatic: No significant vascular findings. Subcentimeter left para-aortic lymph nodes at the level of the celiac and superior mesenteric arteries are unchanged. Reproductive: Unremarkable prostate. Other: No intraperitoneal free fluid. No abdominal wall mass or hernia. Musculoskeletal: No acute osseous abnormality or suspicious osseous lesion. IMPRESSION: 1. Fluid-filled, mildly thick-walled small bowel loops in the central and left lower abdomen suggesting enteritis. 2. Nonobstructing right  nephrolithiasis. Electronically Signed   By: Sebastian Ache M.D.   On: 08/27/2016 09:43    Procedures Procedures (including critical care time)  Medications Ordered in ED Medications  iopamidol (ISOVUE-300) 61 % injection 100 mL (not administered)  sodium chloride 0.9 % bolus 1,000 mL (1,000 mLs Intravenous New Bag/Given 08/27/16 0930)  ketorolac (TORADOL) 30 MG/ML injection 30 mg (30 mg Intravenous Given 08/27/16 0930)  ondansetron (ZOFRAN) injection 4 mg (4 mg Intravenous Given 08/27/16 0930)  iopamidol (ISOVUE-300) 61 % injection (100 mLs  Contrast Given 08/27/16 0915)     Initial Impression / Assessment and Plan / ED Course  I have reviewed the triage vital signs and the nursing notes.  Pertinent labs & imaging results that were available during my care of the patient were reviewed by me and considered in my medical decision making (see chart for details).     Patient with most likely viral gastroenteritis. CBC unremarkable. CMP shows 100 glucose, creatinine 1.32, calcium 8.7, albumin 3.4, AST 12, ALT 13, anion gap 4. Lipase 16. UA shows moderate hematuria, small leukocytes. Urine culture sent and would treat if positive. CT abdomen and pelvis shows fluid filled, mildly thick walled small bowel loops in the central and left lower abdomen suggesting enteritis; nonobstructing right nephrolithiasis. Patient tolerating fluid in the ED. Patient given IV fluids, Toradol, Zofran with good relief. Stable vitals, afebrile. Patient discharged home with Zofran. Return precautions given. Patient advised to follow-up with primary care provider for recheck of hematuria after clearing of enteritis and rehydration. Patient understands and agrees with plan. Patient vitals stable throughout ED course and discharged in satisfactory condition.  Final Clinical Impressions(s) / ED Diagnoses   Final diagnoses:  Enteritis  Nausea vomiting and diarrhea  Periumbilical abdominal pain    New Prescriptions New  Prescriptions   ONDANSETRON (ZOFRAN) 4 MG TABLET    Take 1 tablet (4 mg total) by mouth every 6 (six) hours.     Emi Holes, PA-C 08/27/16 1100    Arby Barrette, MD 08/27/16 [phone]

## 2016-08-27 NOTE — Discharge Instructions (Signed)
Medications: Zofran  Treatment: Take Zofran every 6 hours as needed for nausea or vomiting. Make sure to drink plenty of water.  Follow-up: Please return to emergency department if you develop a fever over 100.4, have intractable vomiting, if your abdominal pain is localizing, or any other concerning symptoms. Your urine showed some blood in it today. This could be due to dehydration from your current GI virus, however please follow-up with your primary care provider to have urine rechecked to make sure this blood is clearing.

## 2016-08-27 NOTE — ED Notes (Signed)
Patient transported to CT 

## 2016-08-28 LAB — URINE CULTURE
Culture: NO GROWTH
Special Requests: NORMAL

## 2016-12-26 ENCOUNTER — Emergency Department (HOSPITAL_COMMUNITY)
Admission: EM | Admit: 2016-12-26 | Discharge: 2016-12-26 | Disposition: A | Discharge status: Home / Self Care | Payer: Worker's Compensation | Attending: Emergency Medicine | Admitting: Emergency Medicine

## 2016-12-26 ENCOUNTER — Emergency Department (HOSPITAL_COMMUNITY): Payer: Worker's Compensation

## 2016-12-26 ENCOUNTER — Encounter (HOSPITAL_COMMUNITY): Payer: Self-pay

## 2016-12-26 DIAGNOSIS — Y929 Unspecified place or not applicable: Secondary | ICD-10-CM | POA: Insufficient documentation

## 2016-12-26 DIAGNOSIS — Y990 Civilian activity done for income or pay: Secondary | ICD-10-CM | POA: Insufficient documentation

## 2016-12-26 DIAGNOSIS — W228XXA Striking against or struck by other objects, initial encounter: Secondary | ICD-10-CM | POA: Diagnosis not present

## 2016-12-26 DIAGNOSIS — Y9339 Activity, other involving climbing, rappelling and jumping off: Secondary | ICD-10-CM | POA: Diagnosis not present

## 2016-12-26 DIAGNOSIS — M25561 Pain in right knee: Secondary | ICD-10-CM | POA: Diagnosis not present

## 2016-12-26 DIAGNOSIS — S8991XA Unspecified injury of right lower leg, initial encounter: Secondary | ICD-10-CM | POA: Diagnosis present

## 2016-12-26 DIAGNOSIS — I10 Essential (primary) hypertension: Secondary | ICD-10-CM | POA: Diagnosis not present

## 2016-12-26 NOTE — ED Provider Notes (Signed)
[facility]-EMERGENCY DEPT Provider Note   CSN: [id number] Arrival date & time: 12/26/16  1319  By signing my name below, I, Jacob Freeman, attest that this documentation has been prepared under the direction and in the presence of Person Memorial HospitalJaime Ziaire Bieser, PA-C. Electronically Signed: Linna Darnerussell Freeman, Scribe. 12/26/2016. 2:31 PM.  History   Chief Complaint Chief Complaint  Patient presents with  . Knee Injury   The history is provided by the patient. No language interpreter was used.    HPI Comments: Jacob Freeman is a 46 y.o. male with PMHx including HTN who presents to the Emergency Department complaining of sudden onset, constant, 9/10 right knee pain beginning a couple of hours ago. He states he struck his right knee against a tractor trailer while climbing into the vehicle at work. He denies experiencing any popping or pulling sensations from his right knee when he sustained the injury. Patient describes his pain as sharp and shooting. No medications or treatments tried PTA. He states his pain is worse with weight bearing and he is ambulatory with difficulty secondary to pain. No h/o right knee problems. No h/o knee surgery. Patient denies joint swelling, open wounds, bruising, numbness/tingling, or any other associated symptoms.  Past Medical History:  Diagnosis Date  . Chest pain, unspecified   . Esophageal reflux   . Hematuria   . Hypercholesteremia   . Hypertension   . Nephrolithiasis   . Pain in joint, hand     There are no active problems to display for this patient.   Past Surgical History:  Procedure Laterality Date  . TONSILLECTOMY         Home Medications    Prior to Admission medications   Medication Sig Start Date End Date Taking? Authorizing Provider  acetaminophen (TYLENOL) 500 MG tablet Take 1,000 mg by mouth every 6 (six) hours as needed for moderate pain.    [provider]  amLODipine (NORVASC) 10 MG tablet Take 10 mg by mouth daily.    [provider]   atorvastatin (LIPITOR) 40 MG tablet Take 40 mg by mouth daily.    [provider]  cephALEXin (KEFLEX) 500 MG capsule Take 1 capsule (500 mg total) by mouth 2 (two) times daily. Patient not taking: Reported on 08/27/2016 01/17/16   Audry PiliMohr, Tyler, PA-C  hydrochlorothiazide (HYDRODIURIL) 12.5 MG tablet Take 12.5 mg by mouth daily. 01/01/16   [provider]  lisinopril (PRINIVIL,ZESTRIL) 40 MG tablet Take 40 mg by mouth every morning.     [provider]  naproxen sodium (ANAPROX) 220 MG tablet Take 220 mg by mouth 2 (two) times daily with a meal.    [provider]  ondansetron (ZOFRAN) 4 MG tablet Take 1 tablet (4 mg total) by mouth every 6 (six) hours. 08/27/16   Law, Alexandra M, PA-C  Pseudoephedrine-APAP-DM (DAYQUIL PO) Take 30 mLs by mouth every 8 (eight) hours as needed. For cold and flu symptoms    [provider]    Family History Family History  Problem Relation Age of Onset  . Brain cancer Father   . Hypertension Mother     Social History Social History  Substance Use Topics  . Smoking status: Never Smoker  . Smokeless tobacco: Never Used  . Alcohol use No     Allergies   Patient has no known allergies.   Review of Systems Review of Systems  Musculoskeletal: Positive for arthralgias and gait problem. Negative for joint swelling.  Skin: Negative for color change and wound.  Neurological: Negative for numbness.   Physical Exam Updated Vital Signs BP (!) 147/97 (BP Location: Right Arm)   Pulse 83   Temp 98.4 F (36.9 C) (Oral)   Resp 16   Ht 5\' 11"  (1.803 m)   Wt 106.6 kg (235 lb)   SpO2 96%   BMI 32.78 kg/m   Physical Exam  Constitutional: He is oriented to person, place, and time. He appears well-developed and well-nourished. No distress.  HENT:  Head: Normocephalic and atraumatic.  Cardiovascular: Normal rate, regular rhythm and normal heart sounds.   No murmur heard. Pulmonary/Chest: Effort normal and breath  sounds normal. No respiratory distress.  Abdominal: Soft. He exhibits no distension. There is no tenderness.  Musculoskeletal: He exhibits tenderness. He exhibits no edema.  Right knee with tenderness to palpation of patella. Full ROM. No joint line tenderness. No joint effusion or swelling appreciated. No abnormal alignment or patellar mobility. No bruising, erythema or warmth overlaying the joint. No varus/valgus laxity. Negative drawer's, Lachman's and McMurray's.  No crepitus. 2+ DP pulses bilaterally. All compartments are soft. Sensation intact distal to injury.  Neurological: He is alert and oriented to person, place, and time.  Bilateral lower extremities neurovascularly intact.   Skin: Skin is warm and dry.  Nursing note and vitals reviewed.  ED Treatments / Results  Labs (all labs ordered are listed, but only abnormal results are displayed) Labs Reviewed - No data to display  EKG  EKG Interpretation None       Radiology Dg Knee Complete 4 Views Right  Result Date: 12/26/2016 CLINICAL DATA:  46 year old who injured the right knee earlier today when he struck the right knee on a truck. Peripatellar pain and swelling. Initial encounter. EXAM: RIGHT KNEE - COMPLETE 4+ VIEW COMPARISON:  None. FINDINGS: Minimal to mild prepatellar soft tissue swelling. No evidence of acute fracture or dislocation. Well-preserved joint spaces. Well-preserved bone mineral density. No intrinsic osseous abnormality. No visible joint effusion. IMPRESSION: No osseous abnormality. Electronically Signed   By: Hulan Saas M.D.   On: 12/26/2016 14:49    Procedures Procedures (including critical care time)  DIAGNOSTIC STUDIES: Oxygen Saturation is 96% on RA, adequate by my interpretation.    COORDINATION OF CARE: 2:30 PM Discussed treatment plan with pt at bedside and pt agreed to plan.  Medications Ordered in ED Medications - No data to display   Initial Impression / Assessment and Plan / ED  Course  I have reviewed the triage vital signs and the nursing notes.  Pertinent labs & imaging results that were available during my care of the patient were reviewed by me and considered in my medical decision making (see chart for details).    Jacob Freeman is a 46 y.o. male who presents to ED for right knee pain s/p hitting on trailer at work. On exam, patient with tenderness to palpation of patella likely 2/2 contusion. Full ROM. RLE NVI. Patient is afebrile and without erythema/warmth of the affected area. X-ray without acute finding. Ambulatory in ED without difficulty. Knee sleeve provided in ED for comfort. Patient advised to follow up with orthopedics if symptoms persist longer than one week for further evaluation. Return precaurtions discussed. All questions answered.   Final Clinical Impressions(s) / ED Diagnoses   Final diagnoses:  Acute pain of right knee    New Prescriptions New Prescriptions   No medications on file   I personally performed the services described in this documentation, which was scribed in my presence. The recorded  information has been reviewed and is accurate.    Emiya Loomer, Chase Picket, PA-C 12/26/16 1513    Nira Conn, MD 12/27/16 [phone]

## 2016-12-26 NOTE — Discharge Instructions (Signed)
It was my pleasure taking care of you today!   Ibuprofen as needed for pain. Ice and elevate knee throughout the day.  Follow up with the orthopedist listed if symptoms do not improve in one week.   Return to the ER for new or worsening symptoms, any additional concerns.

## 2016-12-26 NOTE — ED Notes (Signed)
Bed: WTR5 Expected date:  Expected time:  Means of arrival:  Comments: 

## 2016-12-26 NOTE — ED Triage Notes (Signed)
Patient was climbing into a tractor trailer at work, hitting his knee on the door latch on the back of the truck. Patient c/o right knee pain and has slight swelling to the right knee. Pain is worse with weight bearing.

## 2017-02-10 ENCOUNTER — Ambulatory Visit: Payer: Self-pay | Admitting: Family Medicine

## 2017-08-26 ENCOUNTER — Ambulatory Visit (INDEPENDENT_AMBULATORY_CARE_PROVIDER_SITE_OTHER): Payer: Managed Care, Other (non HMO) | Admitting: Family Medicine

## 2017-08-26 ENCOUNTER — Encounter: Payer: Self-pay | Admitting: Family Medicine

## 2017-08-26 VITALS — BP 142/90 | HR 78 | Temp 98.7°F | Resp 18 | Ht 71.0 in | Wt 266.0 lb

## 2017-08-26 DIAGNOSIS — I10 Essential (primary) hypertension: Secondary | ICD-10-CM | POA: Diagnosis not present

## 2017-08-26 DIAGNOSIS — J069 Acute upper respiratory infection, unspecified: Secondary | ICD-10-CM | POA: Diagnosis not present

## 2017-08-26 DIAGNOSIS — E785 Hyperlipidemia, unspecified: Secondary | ICD-10-CM

## 2017-08-26 DIAGNOSIS — Z131 Encounter for screening for diabetes mellitus: Secondary | ICD-10-CM | POA: Diagnosis not present

## 2017-08-26 DIAGNOSIS — R7989 Other specified abnormal findings of blood chemistry: Secondary | ICD-10-CM

## 2017-08-26 DIAGNOSIS — R799 Abnormal finding of blood chemistry, unspecified: Secondary | ICD-10-CM

## 2017-08-26 LAB — POCT GLYCOSYLATED HEMOGLOBIN (HGB A1C)

## 2017-08-26 MED ORDER — AMLODIPINE BESYLATE 10 MG PO TABS: 10.0000 mg | ORAL_TABLET | Freq: Every day | ORAL | 3 refills | Status: DC

## 2017-08-26 MED ORDER — IPRATROPIUM BROMIDE 0.03 % NA SOLN: 2.0000 | Freq: Two times a day (BID) | NASAL | 0 refills | Status: DC

## 2017-08-26 NOTE — Progress Notes (Signed)
Patient ID: Jacob Freeman, male    DOB: 04/08/1971, 47 y.o.   MRN: [id number]  PCP: Bing Neighbors, FNP  Chief Complaint  Patient presents with  . Establish Care    Subjective:  HPI  Jacob Freeman is a 47 y.o. male with a history of hypertension, hyperlipidemia, chronic allergies presents to establish care.  He was previously followed by [facility].  He reports today only history significant for hypertension.  He is been out of medications for some time and recently checked his blood pressure it was elevated.  He was previously prescribed hydrochlorothiazide, Norvasc, and lisinopril.  He reports that while on lisinopril his kidney function was abnormal with therefore his previous provider discontinued medication.  Last medications he is taking his amlodipine and hydrochlorothiazide.  Reports routine physical activity with mostly bicycling at least 10-15 minutes/day.  He works evening shift therefore eats dinner around 2 or 3 in the morning. He denies any persistent headaches, chest pain, shortness of breath, or new weakness.  He is concerned today regarding an ongoing cough and distantly runny nose which has been present for close to 1 week.  He has been negative for fever, shortness of breath, wheezing, or body aches.  He has did relief with DayQuil only.  He declined immunizations today flu and tetanus. Social History   Socioeconomic History  . Marital status: Married    Spouse name: Not on file  . Number of children: Not on file  . Years of education: Not on file  . Highest education level: Not on file  Social Needs  . Financial resource strain: Not on file  . Food insecurity - worry: Not on file  . Food insecurity - inability: Not on file  . Transportation needs - medical: Not on file  . Transportation needs - non-medical: Not on file  Occupational History  . Not on file  Tobacco Use  . Smoking status: Never Smoker  . Smokeless tobacco: Never Used  Substance and Sexual  Activity  . Alcohol use: No  . Drug use: No  . Sexual activity: Not on file  Other Topics Concern  . Not on file  Social History Narrative  . Not on file    Family History  Problem Relation Age of Onset  . Brain cancer Father   . Hypertension and Diabetes Mother    Review of Systems  Constitutional: Positive for fatigue.  HENT: Positive for congestion.   Eyes:       Recent eye exam, no changes in current corrective lenses prescription.  Respiratory: Positive for cough. Negative for shortness of breath and wheezing.   Cardiovascular: Negative.   Gastrointestinal: Negative.   Endocrine: Negative.   Genitourinary: Negative.   Neurological: Negative.   Hematological: Negative.   Psychiatric/Behavioral: Negative.    No Known Allergies  Prior to Admission medications   Medication Sig Start Date End Date Taking? Authorizing Provider  amLODipine (NORVASC) 10 MG tablet Take 10 mg by mouth daily.   Yes [provider]  atorvastatin (LIPITOR) 40 MG tablet Take 40 mg by mouth daily.   Yes [provider]  hydrochlorothiazide (HYDRODIURIL) 12.5 MG tablet Take 12.5 mg by mouth daily. 01/01/16  Yes [provider]  lisinopril (PRINIVIL,ZESTRIL) 40 MG tablet Take 40 mg by mouth every morning.    Yes [provider]  acetaminophen (TYLENOL) 500 MG tablet Take 1,000 mg by mouth every 6 (six) hours as needed for moderate pain.    [provider]  cephALEXin (KEFLEX) 500 MG capsule Take 1 capsule (500 mg total) by mouth 2 (two) times daily. Patient not taking: Reported on 08/27/2016 01/17/16   Audry PiliMohr, Tyler, PA-C  naproxen sodium (ANAPROX) 220 MG tablet Take 220 mg by mouth 2 (two) times daily with a meal.    [provider]  ondansetron (ZOFRAN) 4 MG tablet Take 1 tablet (4 mg total) by mouth every 6 (six) hours. Patient not taking: Reported on 08/26/2017 08/27/16   Emi HolesLaw, Alexandra M, PA-C  Pseudoephedrine-APAP-DM (DAYQUIL PO) Take 30 mLs by mouth  every 8 (eight) hours as needed. For cold and flu symptoms    [provider]    Past Medical, Surgical Family and Social History reviewed and updated.    Objective:   Today's Vitals   08/26/17 1326  BP: (!) 142/90  Pulse: 78  Resp: 18  Temp: 98.7 F (37.1 C)  TempSrc: Oral  SpO2: 100%  Weight: 266 lb (120.7 kg)  Height: 5\' 11"  (1.803 m)    Wt Readings from Last 3 Encounters:  08/26/17 266 lb (120.7 kg)  12/26/16 235 lb (106.6 kg)  08/27/16 240 lb (108.9 kg)    Physical Exam  Constitutional: He is oriented to person, place, and time. He appears well-developed and well-nourished.  HENT:  Right Ear: Hearing, tympanic membrane, external ear and ear canal normal.  Left Ear: Hearing, tympanic membrane, external ear and ear canal normal.  Nose: Mucosal edema and rhinorrhea present.  Mouth/Throat: Uvula is midline and oropharynx is clear and moist.  Eyes: EOM are normal. Pupils are equal, round, and reactive to light.  Neck: Normal range of motion. Neck supple. No thyromegaly present.  Pulmonary/Chest: Effort normal and breath sounds normal.  Musculoskeletal: Normal range of motion.  Lymphadenopathy:    He has no cervical adenopathy.  Neurological: He is alert and oriented to person, place, and time.  Skin: Skin is dry.  Psychiatric: He has a normal mood and affect. His behavior is normal. Judgment and thought content normal.   Assessment & Plan:  1. Essential hypertension, uncontrolled today.  Resuming amlodipine only today.  I would like to check patient's labs before resuming any additional reticulocyte therapy and or lisinopril.  Prior labs from over a year ago indicated an elevated creatinine.  Will re-evaluate with a CMP today if normal will resume hydrochlorothiazide if blood pressure remains uncontrolled. We have discussed target BP range and blood pressure goal. I have advised patient to check BP regularly and to call us back or report to clinic if the numbers  are consistently higher than 140/90. We discussed the importance of compliance with medical therapy and DASH diet recommended, consequences of uncontrolled hypertension discussed.   2. Screening for diabetes mellitus, A1c is 5.6, normal will repeat in 12 months.   3. Upper respiratory tract infection, unspecified type, will treat symptomatically for now as illness has been present for 7 days.  He is remained afebrile, he appears nontoxic, and lung sounds are clear.  Will trial Atrovent nasal spray 2 sprays 2 times daily as needed for congestion.   4. Abnormal serum creatinine level, will continue to hold lisinopril, and recheck CMP today.  5. Hyperlipidemia, patient is not fasting today.  I will check a fasting lipid panel when he returns in 2 weeks for fasting labs.  Reports he has not taken atorvastatin in quite some time.  Will wait for labs prior to resuming.   Patient will return in 2 weeks for fasting labs during that  time I will collect a CMP, CBC, thyroid and lipid panel.   Godfrey Pick. Tiburcio Pea, MSN, FNP-C [facility]  [address] [place], [place] [address] [phone]

## 2017-08-27 ENCOUNTER — Ambulatory Visit: Payer: Self-pay | Admitting: Family Medicine

## 2017-08-29 ENCOUNTER — Telehealth: Payer: Self-pay

## 2017-08-29 NOTE — Telephone Encounter (Signed)
Patient was advise to go to ED or urgent care for high blood pressure and red eyes. Also to follow up in office with [name] next week after appointment.

## 2017-08-30 ENCOUNTER — Emergency Department (HOSPITAL_COMMUNITY): Payer: Managed Care, Other (non HMO)

## 2017-08-30 ENCOUNTER — Encounter (HOSPITAL_COMMUNITY): Payer: Self-pay | Admitting: *Deleted

## 2017-08-30 ENCOUNTER — Emergency Department (HOSPITAL_COMMUNITY)
Admission: EM | Admit: 2017-08-30 | Discharge: 2017-08-30 | Disposition: A | Discharge status: Home / Self Care | Payer: Managed Care, Other (non HMO) | Attending: Emergency Medicine | Admitting: Emergency Medicine

## 2017-08-30 DIAGNOSIS — Z79899 Other long term (current) drug therapy: Secondary | ICD-10-CM | POA: Diagnosis not present

## 2017-08-30 DIAGNOSIS — R0981 Nasal congestion: Secondary | ICD-10-CM | POA: Diagnosis present

## 2017-08-30 DIAGNOSIS — J181 Lobar pneumonia, unspecified organism: Secondary | ICD-10-CM | POA: Diagnosis not present

## 2017-08-30 DIAGNOSIS — I10 Essential (primary) hypertension: Secondary | ICD-10-CM | POA: Insufficient documentation

## 2017-08-30 DIAGNOSIS — J189 Pneumonia, unspecified organism: Secondary | ICD-10-CM

## 2017-08-30 LAB — BASIC METABOLIC PANEL
Anion gap: 10 (ref 5–15)
BUN: 8 mg/dL (ref 6–20)
CO2: 25 mmol/L (ref 22–32)
CREATININE: 1.31 mg/dL — AB (ref 0.61–1.24)
Calcium: 9.1 mg/dL (ref 8.9–10.3)
Chloride: 105 mmol/L (ref 101–111)
Glucose, Bld: 102 mg/dL — ABNORMAL HIGH (ref 65–99)
Potassium: 3.3 mmol/L — ABNORMAL LOW (ref 3.5–5.1)
SODIUM: 140 mmol/L (ref 135–145)

## 2017-08-30 LAB — CBC
HCT: 44.6 % (ref 39.0–52.0)
Hemoglobin: 14.3 g/dL (ref 13.0–17.0)
MCH: 26.9 pg (ref 26.0–34.0)
MCHC: 32.1 g/dL (ref 30.0–36.0)
MCV: 83.8 fL (ref 78.0–100.0)
PLATELETS: 199 10*3/uL (ref 150–400)
RBC: 5.32 MIL/uL (ref 4.22–5.81)
RDW: 14.5 % (ref 11.5–15.5)
WBC: 3.8 10*3/uL — AB (ref 4.0–10.5)

## 2017-08-30 LAB — I-STAT TROPONIN, ED: TROPONIN I, POC: 0.01 ng/mL (ref 0.00–0.08)

## 2017-08-30 MED ORDER — AMOXICILLIN-POT CLAVULANATE 875-125 MG PO TABS: 1.0000 | ORAL_TABLET | Freq: Two times a day (BID) | ORAL | 0 refills | Status: DC

## 2017-08-30 MED ORDER — AZITHROMYCIN 250 MG PO TABS: 250.0000 mg | ORAL_TABLET | Freq: Every day | ORAL | 0 refills | Status: DC

## 2017-08-30 NOTE — ED Triage Notes (Signed)
To ED for eval after waking yesterday and feeling dizzy. States his bp read 'high'. Pt states he is able to sleep at night without difficulty but pain in chest is constant. No nausea or vomiting. States he thinks this is due to increased stress - he is a new grandfather of twins that were just extubated. Appears in nad

## 2017-08-30 NOTE — ED Provider Notes (Signed)
[facility] EMERGENCY DEPARTMENT Provider Note   CSN: [id number] Arrival date & time: 08/30/17  7829     History   Chief Complaint Chief Complaint  Patient presents with  . Hypertension  . Chest Pain  . Tingling    HPI Jacob Freeman is a 47 y.o. male.  HPI  The pt is a 47 y/o male - treated for HTN with amlodipine (currently) and formerly on Lisinopril (stopped this week for unknown reasons) - the pt was at his PCP office several days ago for a chest cold and was given a nasal spray Rx for ipratropium for nasal congestion and had taken some occasional robittusin though not in a couple of days - he has had some high blood pressure measurements in the last 24 hours as high as 150/109 (pt not sure on exact diastolic) and had some dizziness specifically yesterday which was transient and no longer present - he has had ongoing cough and some mild chest pain - no pain with breathing, no SOB and no swelling, denies objective fevers - endorses subjective fevers.  He has had no sick contacts.  He is unsure why his PCP didn't refill his BP meds, but thinks that it was a wait and see type process to see if he needed both medicines. The patient denies numbness or tingling,.  Denies any weakness.  Denies any difficulty with balance or speech or vision  Past Medical History:  Diagnosis Date  . Chest pain, unspecified   . Esophageal reflux   . Hematuria   . Hypercholesteremia   . Hypertension   . Nephrolithiasis   . Pain in joint, hand     There are no active problems to display for this patient.   Past Surgical History:  Procedure Laterality Date  . TONSILLECTOMY         Home Medications    Prior to Admission medications   Medication Sig Start Date End Date Taking? Authorizing Provider  acetaminophen (TYLENOL) 500 MG tablet Take 1,000 mg by mouth every 6 (six) hours as needed for moderate pain.    [provider]  amLODipine (NORVASC) 10 MG tablet Take 1  tablet (10 mg total) by mouth daily. 08/26/17   Bing Neighbors, FNP  amoxicillin-clavulanate (AUGMENTIN) 875-125 MG tablet Take 1 tablet by mouth every 12 (twelve) hours. 08/30/17   Eber Hong, MD  atorvastatin (LIPITOR) 40 MG tablet Take 40 mg by mouth daily.    [provider]  azithromycin (ZITHROMAX Z-PAK) 250 MG tablet Take 1 tablet (250 mg total) by mouth daily. 500mg  PO day 1, then 250mg  PO days 205 08/30/17   Eber Hong, MD  hydrochlorothiazide (HYDRODIURIL) 12.5 MG tablet Take 12.5 mg by mouth daily. 01/01/16   [provider]  ipratropium (ATROVENT) 0.03 % nasal spray Place 2 sprays into both nostrils 2 (two) times daily. 08/26/17   Bing Neighbors, FNP  lisinopril (PRINIVIL,ZESTRIL) 40 MG tablet Take 40 mg by mouth every morning.     [provider]  naproxen sodium (ANAPROX) 220 MG tablet Take 220 mg by mouth 2 (two) times daily with a meal.    [provider]  Pseudoephedrine-APAP-DM (DAYQUIL PO) Take 30 mLs by mouth every 8 (eight) hours as needed. For cold and flu symptoms    [provider]    Family History Family History  Problem Relation Age of Onset  . Brain cancer Father   . Hypertension Mother     Social History Social  History   Tobacco Use  . Smoking status: Never Smoker  . Smokeless tobacco: Never Used  Substance Use Topics  . Alcohol use: No  . Drug use: No     Allergies   Patient has no known allergies.   Review of Systems Review of Systems  All other systems reviewed and are negative.    Physical Exam Updated Vital Signs BP (!) 140/93 (BP Location: Right Arm)   Pulse 87   Temp (!) 97.5 F (36.4 C) (Oral)   Resp 12   SpO2 97%   Physical Exam  Constitutional: He appears well-developed and well-nourished. No distress.  HENT:  Head: Normocephalic and atraumatic.  Mouth/Throat: Oropharynx is clear and moist. No oropharyngeal exudate.  Eyes: Conjunctivae and EOM are normal. Pupils are equal,  round, and reactive to light. Right eye exhibits no discharge. Left eye exhibits no discharge. No scleral icterus.  Neck: Normal range of motion. Neck supple. No JVD present. No thyromegaly present.  Cardiovascular: Normal rate, regular rhythm, normal heart sounds and intact distal pulses. Exam reveals no gallop and no friction rub.  No murmur heard. No tachycardia, no murmurs, no edema, no JVD  Pulmonary/Chest: Effort normal and breath sounds normal. No respiratory distress. He has no wheezes. He has no rales.  Clear lung sounds, no rales, speaks in full sentences, no increased work of breathing  Abdominal: Soft. Bowel sounds are normal. He exhibits no distension and no mass. There is no tenderness.  Musculoskeletal: Normal range of motion. He exhibits no edema or tenderness.  Lymphadenopathy:    He has no cervical adenopathy.  Neurological: He is alert. Coordination normal.  Clear speech and coordination, no facial asymmetry  Skin: Skin is warm and dry. No rash noted. No erythema.  Psychiatric: He has a normal mood and affect. His behavior is normal.  Nursing note and vitals reviewed.    ED Treatments / Results  Labs (all labs ordered are listed, but only abnormal results are displayed) Labs Reviewed  BASIC METABOLIC PANEL - Abnormal; Notable for the following components:      Result Value   Potassium 3.3 (*)    Glucose, Bld 102 (*)    Creatinine, Ser 1.31 (*)    All other components within normal limits  CBC - Abnormal; Notable for the following components:   WBC 3.8 (*)    All other components within normal limits  I-STAT TROPONIN, ED    EKG  EKG Interpretation  Date/Time:  Saturday August 30 2017 08:54:01 EST Ventricular Rate:  79 PR Interval:  126 QRS Duration: 88 QT Interval:  386 QTC Calculation: 442 R Axis:   42 Text Interpretation:  Normal sinus rhythm Normal ECG since last tracing no significant change Confirmed by Eber HongMiller, Nohelia Valenza ([id number]) on 08/30/2017 9:05:29  AM       Radiology Dg Chest 2 View  Result Date: 08/30/2017 CLINICAL DATA:  Chest pain, dizziness, hypertension EXAM: CHEST  2 VIEW COMPARISON:  02/11/2011 FINDINGS: Right infrahilar opacity is favored to reflect prominent pulmonary markings. Additional cardial fat at the medial right lung base. No focal consolidation. No pleural effusion or pneumothorax. The heart is normal in size. Visualized osseous structures are within normal limits. IMPRESSION: No evidence of acute cardiopulmonary disease. Electronically Signed   By: Charline BillsSriyesh  Krishnan M.D.   On: 08/30/2017 09:20    Procedures Procedures (including critical care time)  Medications Ordered in ED Medications - No data to display   Initial Impression / Assessment and Plan /  ED Course  I have reviewed the triage vital signs and the nursing notes.  Pertinent labs & imaging results that were available during my care of the patient were reviewed by me and considered in my medical decision making (see chart for details).    EKG without any abnormalities, I have personally viewed and interpreted the 2 view PA and lateral chest x-ray which shows that there is a right infrahilar opacity and in the setting of a chest cold with lots of coughing I suspect this is more of a pneumonia.  The patient is otherwise not having any focal findings on exam to suggest the need for further evaluation.  We will treat for community acquired pneumonia, his blood pressure here is 125/89, will add lisinopril if metabolic panel suggest normal renal function.  Patient is agreeable, very stable appearing and can follow-up in the outpatient setting.  He has been informed of the indications for return and expressed his understanding.  Prior labs reviewed, renal insufficiency present in past, patient given prescriptions for antibiotics as below, informed of his results, no need for increasing blood pressure management at this time.  We will avoid ACE inhibitors given renal  insufficiency  Final Clinical Impressions(s) / ED Diagnoses   Final diagnoses:  Community acquired pneumonia of right lower lobe of lung [facility])    ED Discharge Orders        Ordered    amoxicillin-clavulanate (AUGMENTIN) 875-125 MG tablet  Every 12 hours     08/30/17 1122    azithromycin (ZITHROMAX Z-PAK) 250 MG tablet  Daily     08/30/17 1122       Eber Hong, MD 08/30/17 1123

## 2017-08-30 NOTE — ED Notes (Signed)
ED Provider at bedside. 

## 2017-08-30 NOTE — Discharge Instructions (Signed)
Your testing today shows that you have a slight dysfunction of your kidneys.  They have not changed over time but they do not work perfectly which means that you cannot take that blood pressure medication. Please follow-up with your doctor for blood pressure repeat evaluation within 2 weeks Please take your daily blood pressure medication in the morning and take your blood pressure 2 hours after taking your medication, record this number daily and share this with your doctor in follow-up Please take the antibiotics as prescribed including Augmentin and Zithromax Seek medical attention for severe or worsening symptoms

## 2017-08-30 NOTE — ED Notes (Signed)
Walked patient to the bathroom  

## 2017-09-05 ENCOUNTER — Encounter: Payer: Self-pay | Admitting: Family Medicine

## 2017-09-05 ENCOUNTER — Ambulatory Visit (INDEPENDENT_AMBULATORY_CARE_PROVIDER_SITE_OTHER): Payer: Managed Care, Other (non HMO) | Admitting: Family Medicine

## 2017-09-05 VITALS — BP 138/92 | HR 85 | Temp 98.0°F | Resp 14 | Ht 71.0 in | Wt 263.0 lb

## 2017-09-05 DIAGNOSIS — R799 Abnormal finding of blood chemistry, unspecified: Secondary | ICD-10-CM

## 2017-09-05 DIAGNOSIS — R197 Diarrhea, unspecified: Secondary | ICD-10-CM | POA: Diagnosis not present

## 2017-09-05 DIAGNOSIS — I10 Essential (primary) hypertension: Secondary | ICD-10-CM | POA: Diagnosis not present

## 2017-09-05 DIAGNOSIS — J181 Lobar pneumonia, unspecified organism: Secondary | ICD-10-CM | POA: Diagnosis not present

## 2017-09-05 DIAGNOSIS — R7989 Other specified abnormal findings of blood chemistry: Secondary | ICD-10-CM

## 2017-09-05 DIAGNOSIS — E785 Hyperlipidemia, unspecified: Secondary | ICD-10-CM

## 2017-09-05 DIAGNOSIS — J189 Pneumonia, unspecified organism: Secondary | ICD-10-CM

## 2017-09-05 MED ORDER — PREDNISONE 20 MG PO TABS: 40.0000 mg | ORAL_TABLET | Freq: Every day | ORAL | 0 refills | Status: DC

## 2017-09-05 MED ORDER — LOPERAMIDE HCL 2 MG PO TABS: 2.0000 mg | ORAL_TABLET | Freq: Four times a day (QID) | ORAL | 0 refills | Status: DC | PRN

## 2017-09-05 MED ORDER — ONDANSETRON HCL 4 MG PO TABS: 4.0000 mg | ORAL_TABLET | Freq: Three times a day (TID) | ORAL | 0 refills | Status: DC | PRN

## 2017-09-05 NOTE — Progress Notes (Signed)
Patient ID: Jacob Freeman, male    DOB: 06-13-1971, 47 y.o.   MRN: [id number]  PCP: Bing Neighbors, FNP  Chief Complaint  Patient presents with  . Nasal Congestion  . Pneumonia    Diagnosed saturday    Subjective:  HPI Jacob Freeman is a 47 y.o. male with a history of hypertension, and recent diagnosis of pneumonia, presents for evaluation of fatigue, nasal congestion, and shortness of breath.  Davier was recently diagnosed with right upper lobe pneumonia over 1 week ago.  He was prescribed azithromycin and Augmentin which she reports he is completed the azithromycin and has 3 tablets remaining of the Augmentin.  Since starting antibiotic therapy he has had persistently loose stools and diarrhea.  He is unaware of previously taken Augmentin however has taken and tolerated azithromycin in the past.  He has nausea as well has only been able to tolerate soup and soda.  He also complains of some shortness of breath and chest wall tightness and right upper back pain.  Reports a slight wheeze when lying down and shortness of breath is worse with lying flat.  He continues to have some nasal congestion and had discontinued using the nasal spray originally prescribed to him as he thought the antibiotic will clear for symptoms.  Unaware of having any prior diagnosis of seasonal allergies, or notes that he is often suffering from chronic congestion.  Denies fever, headache, dizziness, or new weakness.  He reports tolerating his amlodipine has not noticed any swelling.  His last visit we opted not to resume the lisinopril until had labs drawn to evaluate renal function.  His last creatinine level was elevated from his recent hospital visit, the doc opted not to resume lisinopril.  Blood pressure is well controlled today. Social History   Socioeconomic History  . Marital status: Married    Spouse name: Not on file  . Number of children: Not on file  . Years of education: Not on file  . Highest education  level: Not on file  Social Needs  . Financial resource strain: Not on file  . Food insecurity - worry: Not on file  . Food insecurity - inability: Not on file  . Transportation needs - medical: Not on file  . Transportation needs - non-medical: Not on file  Occupational History  . Not on file  Tobacco Use  . Smoking status: Never Smoker  . Smokeless tobacco: Never Used  Substance and Sexual Activity  . Alcohol use: No  . Drug use: No  . Sexual activity: Not on file  Other Topics Concern  . Not on file  Social History Narrative  . Not on file    Family History  Problem Relation Age of Onset  . Brain cancer Father   . Hypertension Mother    Review of Systems  Constitutional: Positive for fatigue.  Respiratory: Positive for shortness of breath.   Cardiovascular: Negative.   Gastrointestinal: Positive for diarrhea and nausea.  Musculoskeletal: Positive for arthralgias and back pain.  Neurological: Negative.   Psychiatric/Behavioral: Negative.      Prior to Admission medications   Medication Sig Start Date End Date Taking? Authorizing Provider  acetaminophen (TYLENOL) 500 MG tablet Take 1,000 mg by mouth every 6 (six) hours as needed for moderate pain.   Yes [provider]  amLODipine (NORVASC) 10 MG tablet Take 1 tablet (10 mg total) by mouth daily. 08/26/17  Yes Bing Neighbors, FNP  amoxicillin-clavulanate (AUGMENTIN) 875-125 MG tablet Take  1 tablet by mouth every 12 (twelve) hours. 08/30/17  Yes Eber Hong, MD  atorvastatin (LIPITOR) 40 MG tablet Take 40 mg by mouth daily.   Yes [provider]  azithromycin (ZITHROMAX Z-PAK) 250 MG tablet Take 1 tablet (250 mg total) by mouth daily. 500mg  PO day 1, then 250mg  PO days 205 08/30/17  Yes Eber Hong, MD  hydrochlorothiazide (HYDRODIURIL) 12.5 MG tablet Take 12.5 mg by mouth daily. 01/01/16  Yes [provider]  ipratropium (ATROVENT) 0.03 % nasal spray Place 2 sprays into both nostrils 2 (two)  times daily. 08/26/17  Yes Bing Neighbors, FNP  lisinopril (PRINIVIL,ZESTRIL) 40 MG tablet Take 40 mg by mouth every morning.    Yes [provider]  naproxen sodium (ANAPROX) 220 MG tablet Take 220 mg by mouth 2 (two) times daily with a meal.   Yes [provider]  Pseudoephedrine-APAP-DM (DAYQUIL PO) Take 30 mLs by mouth every 8 (eight) hours as needed. For cold and flu symptoms   Yes [provider]    Past Medical, Surgical Family and Social History reviewed and updated.    Objective:   Today's Vitals   09/05/17 0801  BP: (!) 138/92  Pulse: 85  Resp: 14  Temp: 98 F (36.7 C)  TempSrc: Oral  SpO2: 98%  Weight: 263 lb (119.3 kg)  Height: 5\' 11"  (1.803 m)    Wt Readings from Last 3 Encounters:  09/05/17 263 lb (119.3 kg)  08/26/17 266 lb (120.7 kg)  12/26/16 235 lb (106.6 kg)   Physical Exam  Constitutional: He is oriented to person, place, and time. He appears well-developed and well-nourished.  HENT:  Head: Normocephalic.  Nose: Mucosal edema and rhinorrhea present.  Mouth/Throat: Uvula is midline and oropharynx is clear and moist.  Eyes: Conjunctivae are normal. Pupils are equal, round, and reactive to light.  Neck: Normal range of motion. Neck supple.  Cardiovascular: Normal rate, regular rhythm, normal heart sounds and intact distal pulses.  Pulmonary/Chest: Effort normal and breath sounds normal.  Musculoskeletal: Normal range of motion.  Neurological: He is alert and oriented to person, place, and time.  Psychiatric: He has a normal mood and affect. His behavior is normal. Judgment and thought content normal.    Assessment & Plan:  1. Pneumonia of right upper lobe due to infectious organism [facility]), he has 3 remaining doses of Augmentin.  He is continuing to have some symptoms of shortness of breath and chest tightness.  Patient was treated with 2 antibiotics which are indicated for community-acquired pneumonia.  I will trial him with a  short course of prednisone in efforts to relieve chest tightness and shortness of breath.  His respiratory exam today is unremarkable and he is not distressed nor is his effort abnormal.  Advised to continue 3 remaining doses of Augmentin and start prednisone today.  2. Essential hypertension, improved from prior reading.  He is consistently taking the amlodipine recently prescribed   Without evidence of edema of the bilateral lower extremities.  He will continue antihypertensive  as prescribed.  Checking a TSH level patient was to return previously for fasting labs however became ill will obtain labs today.   3. Abnormal serum creatinine level, repeating a CMP to evaluate renal and liver functioning today.   4. Hyperlipidemia, unspecified hyperlipidemia type, obtaining a fasting lipid panel today.   5. Diarrhea, likely secondary to antibiotic use. Will treat presently with loperamide 4 mg as needed for loose stool. If symptoms persists after completion  of    Meds ordered this encounter  Medications  . predniSONE (DELTASONE) 20 MG tablet    Sig: Take 2 tablets (40 mg total) by mouth daily with breakfast.    Dispense:  10 tablet    Refill:  0    Order Specific Question:   Supervising Provider    Answer:   Quentin AngstJEGEDE, OLUGBEMIGA E [id number]]  . ondansetron (ZOFRAN) 4 MG tablet    Sig: Take 1 tablet (4 mg total) by mouth every 8 (eight) hours as needed for nausea or vomiting.    Dispense:  20 tablet    Refill:  0    Order Specific Question:   Supervising Provider    Answer:   Quentin AngstJEGEDE, OLUGBEMIGA E [id number]]  . loperamide (IMODIUM A-D) 2 MG tablet    Sig: Take 1 tablet (2 mg total) by mouth 4 (four) times daily as needed for diarrhea or loose stools.    Dispense:  30 tablet    Refill:  0    Order Specific Question:   Supervising Provider    Answer:   Quentin AngstJEGEDE, OLUGBEMIGA E [[id number]]      NWGNFAOZ H.[[id number]]      Tessie Ordaz S. [name], MSN, FNP-C [facility]  [address]  [place], [place] [address] [phone]

## 2017-09-05 NOTE — Patient Instructions (Signed)
Complete remaining antibiotics.  For shortness of breath and wheezing I am prescribing you prednisone 40 mg once daily with breakfast.  Complete all medication nausea I am prescribing you Zofran 4 mg you may take every 8 hours as needed for nausea symptoms.  For diarrhea I am prescribing Imodium 2 mg she may take up to 4 times daily as needed. Continue aggressive hydration.    Diarrhea, Adult Diarrhea is when you have loose and water poop (stool) often. Diarrhea can make you feel weak and cause you to get dehydrated. Dehydration can make you tired and thirsty, make you have a dry mouth, and make it so you pee (urinate) less often. Diarrhea often lasts 2-3 days. However, it can last longer if it is a sign of something more serious. It is important to treat your diarrhea as told by your doctor. Follow these instructions at home: Eating and drinking  Follow these recommendations as told by your doctor:  Take an oral rehydration solution (ORS). This is a drink that is sold at pharmacies and stores.  Drink clear fluids, such as: ? Water. ? Ice chips. ? Diluted fruit juice. ? Low-calorie sports drinks.  Eat bland, easy-to-digest foods in small amounts as you are able. These foods include: ? Bananas. ? Applesauce. ? Rice. ? Low-fat (lean) meats. ? Toast. ? Crackers.  Avoid drinking fluids that have a lot of sugar or caffeine in them.  Avoid alcohol.  Avoid spicy or fatty foods.  General instructions   Drink enough fluid to keep your pee (urine) clear or pale yellow.  Wash your hands often. If you cannot use soap and water, use hand sanitizer.  Make sure that all people in your home wash their hands well and often.  Take over-the-counter and prescription medicines only as told by your doctor.  Rest at home while you get better.  Watch your condition for any changes.  Take a warm bath to help with any burning or pain from having diarrhea.  Keep all follow-up visits as told  by your doctor. This is important. Contact a doctor if:  You have a fever.  Your diarrhea gets worse.  You have new symptoms.  You cannot keep fluids down.  You feel light-headed or dizzy.  You have a headache.  You have muscle cramps. Get help right away if:  You have chest pain.  You feel very weak or you pass out (faint).  You have bloody or black poop or poop that look like tar.  You have very bad pain, cramping, or bloating in your belly (abdomen).  You have trouble breathing or you are breathing very quickly.  Your heart is beating very quickly.  Your skin feels cold and clammy.  You feel confused.  You have signs of dehydration, such as: ? Dark pee, hardly any pee, or no pee. ? Cracked lips. ? Dry mouth. ? Sunken eyes. ? Sleepiness. ? Weakness. This information is not intended to replace advice given to you by your health care provider. Make sure you discuss any questions you have with your health care provider. Document Released: 01/01/2008 Document Revised: 02/02/2016 Document Reviewed: 03/21/2015 Elsevier Interactive Patient Education  2018 [employer]

## 2017-09-06 LAB — CBC WITH DIFFERENTIAL/PLATELET
Basophils Absolute: 0 10*3/uL (ref 0.0–0.2)
Basos: 1 %
EOS (ABSOLUTE): 0.1 10*3/uL (ref 0.0–0.4)
EOS: 3 %
HEMATOCRIT: 46.2 % (ref 37.5–51.0)
Hemoglobin: 14.6 g/dL (ref 13.0–17.7)
Immature Grans (Abs): 0 10*3/uL (ref 0.0–0.1)
Immature Granulocytes: 0 %
LYMPHS ABS: 1.4 10*3/uL (ref 0.7–3.1)
Lymphs: 34 %
MCH: 26.5 pg — ABNORMAL LOW (ref 26.6–33.0)
MCHC: 31.6 g/dL (ref 31.5–35.7)
MCV: 84 fL (ref 79–97)
MONOS ABS: 0.2 10*3/uL (ref 0.1–0.9)
Monocytes: 6 %
NEUTROS PCT: 56 %
Neutrophils Absolute: 2.3 10*3/uL (ref 1.4–7.0)
PLATELETS: 201 10*3/uL (ref 150–379)
RBC: 5.5 x10E6/uL (ref 4.14–5.80)
RDW: 15.1 % (ref 12.3–15.4)
WBC: 4.1 10*3/uL (ref 3.4–10.8)

## 2017-09-06 LAB — COMPREHENSIVE METABOLIC PANEL
A/G RATIO: 1.6 (ref 1.2–2.2)
ALBUMIN: 4.3 g/dL (ref 3.5–5.5)
ALK PHOS: 76 IU/L (ref 39–117)
ALT: 24 IU/L (ref 0–44)
AST: 15 IU/L (ref 0–40)
BILIRUBIN TOTAL: 0.6 mg/dL (ref 0.0–1.2)
BUN / CREAT RATIO: 5 — AB (ref 9–20)
BUN: 7 mg/dL (ref 6–24)
CHLORIDE: 105 mmol/L (ref 96–106)
CO2: 24 mmol/L (ref 20–29)
Calcium: 9.7 mg/dL (ref 8.7–10.2)
Creatinine, Ser: 1.3 mg/dL — ABNORMAL HIGH (ref 0.76–1.27)
GFR calc non Af Amer: 65 mL/min/{1.73_m2} (ref >59)
GFR, EST AFRICAN AMERICAN: 76 mL/min/{1.73_m2} (ref >59)
GLOBULIN, TOTAL: 2.7 g/dL (ref 1.5–4.5)
GLUCOSE: 95 mg/dL (ref 65–99)
POTASSIUM: 4.3 mmol/L (ref 3.5–5.2)
SODIUM: 144 mmol/L (ref 134–144)
TOTAL PROTEIN: 7 g/dL (ref 6.0–8.5)

## 2017-09-06 LAB — LIPID PANEL
CHOLESTEROL TOTAL: 182 mg/dL (ref 100–199)
Chol/HDL Ratio: 3.3 ratio (ref 0.0–5.0)
HDL: 55 mg/dL (ref >39)
LDL Calculated: 114 mg/dL — ABNORMAL HIGH (ref 0–99)
Triglycerides: 66 mg/dL (ref 0–149)
VLDL CHOLESTEROL CAL: 13 mg/dL (ref 5–40)

## 2017-09-06 LAB — THYROID PANEL WITH TSH
Free Thyroxine Index: 2 (ref 1.2–4.9)
T3 UPTAKE RATIO: 23 % — AB (ref 24–39)
T4 TOTAL: 8.6 ug/dL (ref 4.5–12.0)
TSH: 2.11 u[IU]/mL (ref 0.450–4.500)

## 2017-10-06 ENCOUNTER — Other Ambulatory Visit: Payer: Self-pay

## 2017-10-06 ENCOUNTER — Emergency Department (HOSPITAL_COMMUNITY): Payer: Worker's Compensation

## 2017-10-06 ENCOUNTER — Encounter (HOSPITAL_COMMUNITY): Payer: Self-pay

## 2017-10-06 ENCOUNTER — Emergency Department (HOSPITAL_COMMUNITY)
Admission: EM | Admit: 2017-10-06 | Discharge: 2017-10-06 | Disposition: A | Discharge status: Home / Self Care | Payer: Worker's Compensation | Attending: Emergency Medicine | Admitting: Emergency Medicine

## 2017-10-06 DIAGNOSIS — Y9389 Activity, other specified: Secondary | ICD-10-CM | POA: Diagnosis not present

## 2017-10-06 DIAGNOSIS — Z79899 Other long term (current) drug therapy: Secondary | ICD-10-CM | POA: Diagnosis not present

## 2017-10-06 DIAGNOSIS — Y999 Unspecified external cause status: Secondary | ICD-10-CM | POA: Insufficient documentation

## 2017-10-06 DIAGNOSIS — S098XXA Other specified injuries of head, initial encounter: Secondary | ICD-10-CM | POA: Insufficient documentation

## 2017-10-06 DIAGNOSIS — Y929 Unspecified place or not applicable: Secondary | ICD-10-CM | POA: Insufficient documentation

## 2017-10-06 DIAGNOSIS — I10 Essential (primary) hypertension: Secondary | ICD-10-CM | POA: Diagnosis not present

## 2017-10-06 DIAGNOSIS — W2209XA Striking against other stationary object, initial encounter: Secondary | ICD-10-CM | POA: Diagnosis not present

## 2017-10-06 DIAGNOSIS — S0990XA Unspecified injury of head, initial encounter: Secondary | ICD-10-CM

## 2017-10-06 MED ORDER — IBUPROFEN 400 MG PO TABS
600.0000 mg | ORAL_TABLET | Freq: Once | ORAL | Status: AC
  Administered 2017-10-06: 600 mg via ORAL
  Filled 2017-10-06: qty 1

## 2017-10-06 NOTE — Discharge Instructions (Signed)
Please read attached information. If you experience any new or worsening signs or symptoms please return to the emergency room for evaluation. Please follow-up with your primary care provider or specialist as discussed.  °

## 2017-10-06 NOTE — ED Provider Notes (Signed)
[facility] EMERGENCY DEPARTMENT Provider Note   CSN: [id number] Arrival date & time: 10/06/17  1820     History   Chief Complaint Chief Complaint  Patient presents with  . Head Injury    HPI Jacob Freeman is a 47 y.o. male.  HPI   47 year old male presents today with complaints of head injury.  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  Patient reports minor generalized headache, denies any nausea vomiting or neurological deficits.  No loss of consciousness.  Patient denies any neck pain.  She was seen in urgent care and sent here for further evaluation.  Patient reports taking Tylenol shortly after the incident which improved his headache, but notes it is starting to wear off.  Patient denies any other injury.  Patient is not on blood thinners.  Past Medical History:  Diagnosis Date  . Chest pain, unspecified   . Esophageal reflux   . Hematuria   . Hypercholesteremia   . Hypertension   . Nephrolithiasis   . Pain in joint, hand     There are no active problems to display for this patient.   Past Surgical History:  Procedure Laterality Date  . TONSILLECTOMY         Home Medications    Prior to Admission medications   Medication Sig Start Date End Date Taking? Authorizing Provider  acetaminophen (TYLENOL) 500 MG tablet Take 1,000 mg by mouth every 6 (six) hours as needed for moderate pain.    [provider]  amLODipine (NORVASC) 10 MG tablet Take 1 tablet (10 mg total) by mouth daily. 08/26/17   Bing Neighbors, FNP  amoxicillin-clavulanate (AUGMENTIN) 875-125 MG tablet Take 1 tablet by mouth every 12 (twelve) hours. 08/30/17   Eber Hong, MD  atorvastatin (LIPITOR) 40 MG tablet Take 40 mg by mouth daily.    [provider]  azithromycin (ZITHROMAX Z-PAK) 250 MG tablet Take 1 tablet (250 mg total) by mouth daily. 500mg  PO day 1, then 250mg  PO days 205 08/30/17   Eber Hong, MD   hydrochlorothiazide (HYDRODIURIL) 12.5 MG tablet Take 12.5 mg by mouth daily. 01/01/16   [provider]  ipratropium (ATROVENT) 0.03 % nasal spray Place 2 sprays into both nostrils 2 (two) times daily. 08/26/17   Bing Neighbors, FNP  lisinopril (PRINIVIL,ZESTRIL) 40 MG tablet Take 40 mg by mouth every morning.     [provider]  loperamide (IMODIUM A-D) 2 MG tablet Take 1 tablet (2 mg total) by mouth 4 (four) times daily as needed for diarrhea or loose stools. 09/05/17   Bing Neighbors, FNP  naproxen sodium (ANAPROX) 220 MG tablet Take 220 mg by mouth 2 (two) times daily with a meal.    [provider]  ondansetron (ZOFRAN) 4 MG tablet Take 1 tablet (4 mg total) by mouth every 8 (eight) hours as needed for nausea or vomiting. 09/05/17   Bing Neighbors, FNP  predniSONE (DELTASONE) 20 MG tablet Take 2 tablets (40 mg total) by mouth daily with breakfast. 09/05/17   Bing Neighbors, FNP  Pseudoephedrine-APAP-DM (DAYQUIL PO) Take 30 mLs by mouth every 8 (eight) hours as needed. For cold and flu symptoms    [provider]    Family History Family History  Problem Relation Age of Onset  . Brain cancer Father   . Hypertension Mother     Social History Social History   Tobacco Use  .  Smoking status: Never Smoker  . Smokeless tobacco: Never Used  Substance Use Topics  . Alcohol use: No  . Drug use: No     Allergies   Patient has no known allergies.   Review of Systems Review of Systems  All other systems reviewed and are negative.    Physical Exam Updated Vital Signs BP 130/90 (BP Location: Right Arm)   Pulse 78   Temp 98.6 F (37 C) (Oral)   Resp 18   SpO2 98%   Physical Exam  Constitutional: He is oriented to person, place, and time. He appears well-developed and well-nourished.  HENT:  Head: Normocephalic and atraumatic.  Head is atraumatic  Eyes: Conjunctivae are normal. Pupils are equal, round, and reactive to light.  Right eye exhibits no discharge. Left eye exhibits no discharge. No scleral icterus.  Neck: Normal range of motion. No JVD present. No tracheal deviation present.  Pulmonary/Chest: Effort normal. No stridor.  Neurological: He is alert and oriented to person, place, and time. He has normal strength. He is not disoriented. No cranial nerve deficit or sensory deficit. Coordination normal. GCS eye subscore is 4. GCS verbal subscore is 5. GCS motor subscore is 6.  Psychiatric: He has a normal mood and affect. His behavior is normal. Judgment and thought content normal.  Nursing note and vitals reviewed.    ED Treatments / Results  Labs (all labs ordered are listed, but only abnormal results are displayed) Labs Reviewed - No data to display  EKG  EKG Interpretation None       Radiology Ct Head Wo Contrast  Result Date: 10/06/2017 CLINICAL DATA:  Blunt trauma to the head with headaches, initial encounter EXAM: CT HEAD WITHOUT CONTRAST TECHNIQUE: Contiguous axial images were obtained from the base of the skull through the vertex without intravenous contrast. COMPARISON:  08/16/2010 FINDINGS: Brain: No evidence of acute infarction, hemorrhage, hydrocephalus, extra-axial collection or mass lesion/mass effect. Vascular: No hyperdense vessel or unexpected calcification. Skull: Normal. Negative for fracture or focal lesion. Sinuses/Orbits: No acute finding. Other: None. IMPRESSION: Normal head CT Electronically Signed   By: Alcide CleverMark  Lukens M.D.   On: 10/06/2017 20:33    Procedures Procedures (including critical care time)  Medications Ordered in ED Medications  ibuprofen (ADVIL,MOTRIN) tablet 600 mg (600 mg Oral Given 10/06/17 2122)     Initial Impression / Assessment and Plan / ED Course  I have reviewed the triage vital signs and the nursing notes.  Pertinent labs & imaging results that were available during my care of the patient were reviewed by me and considered in my medical decision  making (see chart for details).      Final Clinical Impressions(s) / ED Diagnoses   Final diagnoses:  Injury of head, initial encounter    Labs:   Imaging: CT head WO  Consults:  Therapeutics: Ibuprofen  Discharge Meds:   Assessment/Plan: 47 year old male presents status post head injury.  He has no signs of trauma on my exam, negative head CT that was ordered prior to my evaluation.  No neurological deficits.  Discharge with symptom medic care instructions and strict return precautions.  Patient verbalized understanding and agreement to today's plan.    ED Discharge Orders    None       Rosalio LoudHedges, Melody Cirrincione, PA-C 10/06/17 2204    Azalia Bilisampos, Kevin, MD 10/06/17 [id number]

## 2017-10-06 NOTE — ED Provider Notes (Signed)
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 Reports headache. Denise nausea, vomiting. No LOC. Reports some changes in vision that come and go.  No neck pain. Went to UC, sent here. No other treatment prior to coming in.   ROS: positive for headache and visual changes  Physical Exam:   Gen: No distress  Neuro: Awake and Alert  Skin: Warm    Focused Exam: small hematoma to top of the head. PERRLA. No midline cervical spine tenderness. Extraoccular movements intact. 5/5 and equal bilateral upper and lower extremity strength. Gait normal.    Pt with head injury, here with headache and visual changes. Not on blood thinners.  Will get CT head. Exam unremarkable.   Vitals:   10/06/17 1901  BP: (!) 135/95  Pulse: 77  Resp: 18  Temp: 98.6 F (37 C)  TempSrc: Oral  SpO2: 99%      Initiation of care has begun. The patient has been counseled on the process, plan, and necessity for staying for the completion/evaluation, and the remainder of the medical screening examination    Iona CoachKirichenko, Gaylin Bulthuis, PA-C 10/06/17 1910    Azalia Bilisampos, Kevin, MD 10/06/17 2247

## 2017-10-06 NOTE — ED Triage Notes (Signed)
Pt was sent here from an urgent care after he bumped his head on his trailer at work today. No LOC. Pt alert and oriented. States he has had some headache and visual changes. No blood thinners. AOX4.

## 2017-10-17 ENCOUNTER — Ambulatory Visit (HOSPITAL_COMMUNITY)
Admission: RE | Admit: 2017-10-17 | Discharge: 2017-10-17 | Disposition: A | Discharge status: Home / Self Care | Payer: Managed Care, Other (non HMO) | Source: Ambulatory Visit | Attending: Family Medicine | Admitting: Family Medicine

## 2017-10-17 ENCOUNTER — Encounter: Payer: Self-pay | Admitting: Family Medicine

## 2017-10-17 ENCOUNTER — Ambulatory Visit (INDEPENDENT_AMBULATORY_CARE_PROVIDER_SITE_OTHER): Payer: Worker's Compensation | Admitting: Family Medicine

## 2017-10-17 VITALS — BP 128/92 | HR 90 | Temp 97.9°F | Resp 14 | Ht 71.0 in | Wt 265.0 lb

## 2017-10-17 DIAGNOSIS — I10 Essential (primary) hypertension: Secondary | ICD-10-CM

## 2017-10-17 DIAGNOSIS — S0990XA Unspecified injury of head, initial encounter: Secondary | ICD-10-CM

## 2017-10-17 DIAGNOSIS — Z8701 Personal history of pneumonia (recurrent): Secondary | ICD-10-CM

## 2017-10-17 DIAGNOSIS — M542 Cervicalgia: Secondary | ICD-10-CM | POA: Diagnosis not present

## 2017-10-17 MED ORDER — CYCLOBENZAPRINE HCL 10 MG PO TABS: 10.0000 mg | ORAL_TABLET | Freq: Three times a day (TID) | ORAL | 0 refills | Status: DC | PRN

## 2017-10-17 NOTE — Progress Notes (Signed)
Patient ID: Jacob Freeman, male    DOB: 11/09/1970, 47 y.o.   MRN: [id number]  PCP: Bing NeighborsHarris, Gearald Stonebraker S, FNP  Chief Complaint  Patient presents with  . Follow-up    6 weeks on pmeumonia    Subjective:  HPI Jacob Freeman is a 47 y.o. male presents to follow-up on pneumonia and evaluation of symptoms resulting from a recent head injury.  Jacob Freeman reports resolution of shortness of breath, cough, and chest tightness which existed during a recent bout with pneumonia.  He has not had a follow-up chest x-ray since 09/05/2017.  His pneumonia was localized to the right upper lobe.  He has been free of fever and fatigue.  He completed all prescribed medications. He is concerned today regarding a recent head injury he sustained while working.  He hit the top of his head on the bottom of his trailer.  He had a CT of the head which was negative this injury occurred on 10/06/2017.  He has not had neurology follow-up although complains of persistent head and neck pain.  While in the ED he was prescribed amitriptyline and meclizine and reports he is only started medications a couple days ago and is uncertain of the effectiveness.  He continues to feel dizzy and lightheaded upon standing and even sometimes with sitting.  Dizziness precipitates nausea although he denies vomiting.  He still feels off balance with walking.  He has headache pain although neck pain is of great concern as he feels a sharp pain radiating down his neck with a headache.  He also complains of some left photosensitivity as well as burning which is new since injury occurred.  He denies any visual diminishment and or weakness of extremities.  He denies any loss of consciousness, memory impairment, or syncope. Social History   Socioeconomic History  . Marital status: Married    Spouse name: Not on file  . Number of children: Not on file  . Years of education: Not on file  . Highest education level: Not on file  Occupational History  . Not on file   Social Needs  . Financial resource strain: Not on file  . Food insecurity:    Worry: Not on file    Inability: Not on file  . Transportation needs:    Medical: Not on file    Non-medical: Not on file  Tobacco Use  . Smoking status: Never Smoker  . Smokeless tobacco: Never Used  Substance and Sexual Activity  . Alcohol use: No  . Drug use: No  . Sexual activity: Not on file  Lifestyle  . Physical activity:    Days per week: Not on file    Minutes per session: Not on file  . Stress: Not on file  Relationships  . Social connections:    Talks on phone: Not on file    Gets together: Not on file    Attends religious service: Not on file    Active member of club or organization: Not on file    Attends meetings of clubs or organizations: Not on file    Relationship status: Not on file  . Intimate partner violence:    Fear of current or ex partner: Not on file    Emotionally abused: Not on file    Physically abused: Not on file    Forced sexual activity: Not on file  Other Topics Concern  . Not on file  Social History Narrative  . Not on file    Family  History  Problem Relation Age of Onset  . Brain cancer Father   . Hypertension Mother    Review of Systems Pertinent negatives listed in HPI There are no active problems to display for this patient.   No Known Allergies  Prior to Admission medications   Medication Sig Start Date End Date Taking? Authorizing Provider  amLODipine (NORVASC) 10 MG tablet Take 1 tablet (10 mg total) by mouth daily. 08/26/17  Yes Bing Neighbors, FNP  acetaminophen (TYLENOL) 500 MG tablet Take 1,000 mg by mouth every 6 (six) hours as needed for moderate pain.    [provider]  amitriptyline (ELAVIL) 50 MG tablet Take 50 mg by mouth. 10/12/17   [provider]  atorvastatin (LIPITOR) 40 MG tablet Take 40 mg by mouth daily.    [provider]  hydrochlorothiazide (HYDRODIURIL) 12.5 MG tablet Take 12.5 mg by mouth  daily. 01/01/16   [provider]  lisinopril (PRINIVIL,ZESTRIL) 40 MG tablet Take 40 mg by mouth every morning.     [provider]  meclizine (ANTIVERT) 25 MG tablet Take 25 mg by mouth as needed. 10/12/17   [provider]    Past Medical, Surgical Family and Social History reviewed and updated.    Objective:   Today's Vitals   10/17/17 0826  BP: (!) 128/92  Pulse: 90  Resp: 14  Temp: 97.9 F (36.6 C)  TempSrc: Oral  SpO2: 98%  Weight: 265 lb (120.2 kg)  Height: 5\' 11"  (1.803 m)    Wt Readings from Last 3 Encounters:  10/17/17 265 lb (120.2 kg)  09/05/17 263 lb (119.3 kg)  08/26/17 266 lb (120.7 kg)    Physical Exam Constitutional: Patient appears well-developed and well-nourished. No distress. HENT: Normocephalic, atraumatic, External right and left ear normal. Oropharynx is clear and moist.  Eyes: Conjunctivae and EOM are normal. PERRLA, no scleral icterus. Neck: Pain elicited with hyperextension of neck. Neck supple. No JVD. No tracheal deviation. No thyromegaly. CVS: RRR, S1/S2 +, no murmurs, no gallops, no carotid bruit.  Pulmonary: Effort and breath sounds normal, no stridor, rhonchi, wheezes, rales.  Abdominal: Soft. BS +, no distension, tenderness, rebound or guarding.  Musculoskeletal: Normal range of motion. No edema and no tenderness.  Lymphadenopathy: No lymphadenopathy noted, cervical, inguinal or axillary Neuro: Alert. Normal reflexes, muscle tone, gait stable, normal strength. Skin: Skin is warm and dry. No rash noted. Not diaphoretic. No erythema. No pallor. Psychiatric: Normal mood and affect. Behavior, judgment, thought content normal.  Assessment & Plan:  1. Injury of head, initial encounter, patient continues have symptoms associated with recent head injury including   Dizziness, imbalance, headaches, nausea. Only recently started amtriplytine and meclizine therefore uncertain of the effectiveness. Will refer to Neurology for  a second opinion. Patient has been unable to return to work. CT of Head normal.  2. Hx of bacterial pneumonia, will obtain a repeat chest x-ray. DG Chest 2 View,   3. Essential hypertension, stable, diastolic slightly increased. No medication changes today. We have discussed target BP range and blood pressure goal. I have advised patient to check BP regularly and to call us back or report to clinic if the numbers are consistently higher than 140/90. We discussed the importance of compliance with medical therapy and DASH diet recommended, consequences of uncontrolled hypertension discussed.    4. Neck pain, occurring in association with headache. Will trial cyclobenzaprine 10 mg up to 3 times daily as needed.   Dg Chest 2 View  Result Date: 10/17/2017 CLINICAL DATA:  Left-sided pneumonia. EXAM: CHEST - 2 VIEW COMPARISON:  08/30/2017 FINDINGS: Cardiomediastinal silhouette is normal. Mediastinal contours appear intact. There is no evidence of focal airspace consolidation, pleural effusion or pneumothorax. Mild crowding of the interstitial markings. Osseous structures are without acute abnormality. Soft tissues are grossly normal. IMPRESSION: Mild crowding of the interstitial markings which may be due to hypoinflation or bronchitis/bronchiolitis. No evidence of lobar consolidation. Electronically Signed   By: Ted Mcalpine M.D.   On: 10/17/2017 09:44   Ct Head Wo Contrast  Result Date: 10/06/2017 CLINICAL DATA:  Blunt trauma to the head with headaches, initial encounter EXAM: CT HEAD WITHOUT CONTRAST TECHNIQUE: Contiguous axial images were obtained from the base of the skull through the vertex without intravenous contrast. COMPARISON:  08/16/2010 FINDINGS: Brain: No evidence of acute infarction, hemorrhage, hydrocephalus, extra-axial collection or mass lesion/mass effect. Vascular: No hyperdense vessel or unexpected calcification. Skull: Normal. Negative for fracture or focal lesion.  Sinuses/Orbits: No acute finding. Other: None. IMPRESSION: Normal head CT Electronically Signed   By: Alcide Clever M.D.   On: 10/06/2017 20:33    RTC: Keep current follow-up on-file      Godfrey Pick. Tiburcio Pea, MSN, FNP-C [facility]  [address] [place], [place] [address] [phone]

## 2017-10-17 NOTE — Patient Instructions (Signed)
Head Injury, Adult  There are many types of head injuries. They can be as minor as a bump. Some head injuries can be worse. Worse injuries include:  · A strong hit to the head that hurts the brain (concussion).  · A bruise of the brain (contusion). This means there is bleeding in the brain that can cause swelling.  · A cracked skull (skull fracture).  · Bleeding in the brain that gathers, gets thick (makes a clot), and forms a bump (hematoma).    Most problems from a head injury come in the first 24 hours. However, you may still have side effects up to 7-10 days after your injury. It is important to watch your condition for any changes.  Follow these instructions at home:  Activity  · Rest as much as possible.  · Avoid activities that are hard or tiring.  · Make sure you get enough sleep.  · Limit activities that need a lot of thought or attention, such as:  ? Watching TV.  ? Playing memory games and puzzles.  ? Job-related work or homework.  ? Working on the computer, social media, and texting.  · Avoid activities that could cause another head injury until your doctor says it is okay. This includes playing sports.  · Ask your doctor when it is safe for you to go back to your normal activities, such as work or school. Ask your doctor for a step-by-step plan for slowly going back to your normal activities.  · Ask your doctor when you can drive, ride a bicycle, or use heavy machinery. Never do these activities if you are dizzy.  Lifestyle  · Do not drink alcohol until your doctor says it is okay.  · Avoid drug use.  · If it is harder than usual to remember things, write them down.  · If you are easily distracted, try to do one thing at a time.  · Talk with family members or close friends when making important decisions.  · Tell your friends, family, a trusted coworker, and work manager about your injury, symptoms, and limits (restrictions). Have them watch for any problems that are new or getting worse.  General  instructions  · Take over-the-counter and prescription medicines only as told by your doctor.  · Have someone stay with you for 24 hours after your head injury. This person should watch you for any changes in your symptoms and be ready to get help.  · Keep all follow-up visits as told by your doctor. This is important.  How is this prevented?  · Work on your balance and strength. This can help you avoid falls.  · Wear a seatbelt when you are in a moving vehicle.  · Wear a helmet when:  ? Riding a bicycle.  ? Skiing.  ? Doing any other sport or activity that has a risk of injury.  · Drink alcohol only in moderation.  · Make your home safer by:  ? Getting rid of clutter from the floors and stairs, like things that can make you trip.  ? Using grab bars in bathrooms and handrails by stairs.  ? Placing non-slip mats on floors and in bathtubs.  ? Putting more light in dim areas.  Get help right away if:  · You have:  ? A very bad (severe) headache that is not helped by medicine.  ? Trouble walking or weakness in your arms and legs.  ? Clear or bloody fluid coming from your nose   or ears.  ? Changes in your seeing (vision).  ? Jerky movements that you cannot control (seizure).  · You throw up (vomit).  · Your symptoms get worse.  · You lose balance.  · Your speech is slurred.  · You pass out.  · You are sleepier and have trouble staying awake.  · The black centers of your eyes (pupils) change in size.  These symptoms may be an emergency. Do not wait to see if the symptoms will go away. Get medical help right away. Call your local emergency services (911 in the [place] Do not drive yourself to the hospital.  This information is not intended to replace advice given to you by your health care provider. Make sure you discuss any questions you have with your health care provider.  Document Released: 06/27/2008 Document Revised: 11/08/2016 Document Reviewed: 01/23/2016  Elsevier Interactive Patient Education © 2018 [employer]  Inc[employer]

## 2017-10-27 ENCOUNTER — Telehealth: Payer: Self-pay

## 2017-10-27 NOTE — Telephone Encounter (Signed)
[facility] do not deal with worker's comp.

## 2017-10-27 NOTE — Telephone Encounter (Signed)
Left a vm for patient to callback 

## 2017-10-27 NOTE — Telephone Encounter (Signed)
Notify patient that neurology declined to see him as this complaint of head injury is related to worker's comp. He will need to continue management with occupational health at the clinic his employer referred him to.  Jacob PickKimberly S. [name], MSN, FNP-C [facility]  [address] [place], [place] [address] [phone]

## 2017-10-28 NOTE — Telephone Encounter (Signed)
Left another vm for patient to callback  

## 2017-10-28 NOTE — Telephone Encounter (Signed)
Patient notified

## 2017-10-28 NOTE — Telephone Encounter (Signed)
Patient notified and wants to know about the imaging that he had done.

## 2017-10-28 NOTE — Telephone Encounter (Signed)
His chest x-ray showed resolution of pneumonia

## 2017-11-24 ENCOUNTER — Emergency Department (HOSPITAL_COMMUNITY): Payer: Managed Care, Other (non HMO)

## 2017-11-24 ENCOUNTER — Emergency Department (HOSPITAL_COMMUNITY)
Admission: EM | Admit: 2017-11-24 | Discharge: 2017-11-24 | Disposition: A | Discharge status: Home / Self Care | Payer: Managed Care, Other (non HMO) | Attending: Emergency Medicine | Admitting: Emergency Medicine

## 2017-11-24 ENCOUNTER — Encounter (HOSPITAL_COMMUNITY): Payer: Self-pay | Admitting: *Deleted

## 2017-11-24 DIAGNOSIS — Y999 Unspecified external cause status: Secondary | ICD-10-CM | POA: Insufficient documentation

## 2017-11-24 DIAGNOSIS — M62838 Other muscle spasm: Secondary | ICD-10-CM

## 2017-11-24 DIAGNOSIS — Y939 Activity, unspecified: Secondary | ICD-10-CM | POA: Insufficient documentation

## 2017-11-24 DIAGNOSIS — Z79899 Other long term (current) drug therapy: Secondary | ICD-10-CM | POA: Insufficient documentation

## 2017-11-24 DIAGNOSIS — S0083XA Contusion of other part of head, initial encounter: Secondary | ICD-10-CM | POA: Diagnosis not present

## 2017-11-24 DIAGNOSIS — S060X0A Concussion without loss of consciousness, initial encounter: Secondary | ICD-10-CM | POA: Diagnosis not present

## 2017-11-24 DIAGNOSIS — Y929 Unspecified place or not applicable: Secondary | ICD-10-CM | POA: Insufficient documentation

## 2017-11-24 DIAGNOSIS — W0110XA Fall on same level from slipping, tripping and stumbling with subsequent striking against unspecified object, initial encounter: Secondary | ICD-10-CM | POA: Diagnosis not present

## 2017-11-24 DIAGNOSIS — S0990XA Unspecified injury of head, initial encounter: Secondary | ICD-10-CM | POA: Diagnosis present

## 2017-11-24 DIAGNOSIS — M542 Cervicalgia: Secondary | ICD-10-CM

## 2017-11-24 DIAGNOSIS — I10 Essential (primary) hypertension: Secondary | ICD-10-CM | POA: Insufficient documentation

## 2017-11-24 MED ORDER — CYCLOBENZAPRINE HCL 10 MG PO TABS: 10.0000 mg | ORAL_TABLET | Freq: Three times a day (TID) | ORAL | 0 refills | Status: DC | PRN

## 2017-11-24 MED ORDER — ACETAMINOPHEN 500 MG PO TABS
1000.0000 mg | ORAL_TABLET | Freq: Once | ORAL | Status: AC
  Administered 2017-11-24: 1000 mg via ORAL
  Filled 2017-11-24: qty 2

## 2017-11-24 MED ORDER — ONDANSETRON 4 MG PO TBDP: 4.0000 mg | ORAL_TABLET | Freq: Three times a day (TID) | ORAL | 0 refills | Status: DC | PRN

## 2017-11-24 NOTE — Discharge Instructions (Addendum)
Your CAT scans today don't show any acute injuries, you have some mild arthritis in your neck area but otherwise everything looks good. Use flexeril as directed as needed for muscle spasms, but don't drive or operate machinery while taking this medication. You may also use heat to the area of soreness in your neck, no more than 20 minutes per hour. Use zofran as directed as needed for nausea.  Alternate between Ibuprofen and Tylenol for pain. Get plenty of rest, use ice on your head and cheek.  Stay in a quiet, not simulating, dark environment. No TV, computer use, video games, or cell phone use until headache is resolved completely. Follow Up with primary care physician in 5-7 days for recheck of symptoms.  Return to the emergency department if patient becomes lethargic, begins vomiting or other change in mental status, or any other changes/worsening symptoms.

## 2017-11-24 NOTE — ED Provider Notes (Signed)
[facility]-EMERGENCY DEPT Provider Note   CSN: [id number] Arrival date & time: 11/24/17  1212     History   Chief Complaint Chief Complaint  Patient presents with  . Fall  . Facial Injury    HPI Jacob Freeman is a 47 y.o. male with a PMHx of vertigo/lightheadedness on meclizine, GERD, HTN, HLD, and nephrolithiasis, who presents to the ED with complaints of fall yesterday.  Patient states that he has chronic lightheadedness issues, takes meclizine, yesterday night he "lost his balance" and fell, striking his left cheek on the nightstand.  He denies LOC.  He denies any worsening lightheadedness or dizziness than his baseline.  He now complains of 5/10 intermittent pressure-like pain in the left cheek, top of the head, and neck, nonradiating, worse with palpation of the cheek, loud noises, and turning his head, and somewhat improved with ibuprofen.  He reports mild associated nausea as well.  He is not on any blood thinners.  He denies any LOC, vision changes, current lightheadedness/dizziness, chest pain, shortness breath, abdominal pain, vomiting, incontinence of urine or stool, saddle anesthesia or cauda equina symptoms, numbness, tingling, focal weakness, bruises, abrasions, or any other complaints at this time.    The history is provided by the patient and medical records. No language interpreter was used.    Past Medical History:  Diagnosis Date  . Chest pain, unspecified   . Esophageal reflux   . Hematuria   . Hypercholesteremia   . Hypertension   . Nephrolithiasis   . Pain in joint, hand     There are no active problems to display for this patient.   Past Surgical History:  Procedure Laterality Date  . TONSILLECTOMY          Home Medications    Prior to Admission medications   Medication Sig Start Date End Date Taking? Authorizing Provider  acetaminophen (TYLENOL) 500 MG tablet Take 1,000 mg by mouth every 6 (six) hours as needed for moderate  pain.    [provider]  amitriptyline (ELAVIL) 50 MG tablet Take 50 mg by mouth. 10/12/17   [provider]  amLODipine (NORVASC) 10 MG tablet Take 1 tablet (10 mg total) by mouth daily. 08/26/17   Bing Neighbors, FNP  atorvastatin (LIPITOR) 40 MG tablet Take 40 mg by mouth daily.    [provider]  cyclobenzaprine (FLEXERIL) 10 MG tablet Take 1 tablet (10 mg total) by mouth 3 (three) times daily as needed for muscle spasms. 10/17/17   Bing Neighbors, FNP  hydrochlorothiazide (HYDRODIURIL) 12.5 MG tablet Take 12.5 mg by mouth daily. 01/01/16   [provider]  lisinopril (PRINIVIL,ZESTRIL) 40 MG tablet Take 40 mg by mouth every morning.     [provider]  meclizine (ANTIVERT) 25 MG tablet Take 25 mg by mouth as needed. 10/12/17   [provider]    Family History Family History  Problem Relation Age of Onset  . Brain cancer Father   . Hypertension Mother     Social History Social History   Tobacco Use  . Smoking status: Never Smoker  . Smokeless tobacco: Never Used  Substance Use Topics  . Alcohol use: No  . Drug use: No     Allergies   Patient has no known allergies.   Review of Systems Review of Systems  HENT:       +L cheek pain  Eyes: Negative for visual disturbance.  Respiratory: Negative for shortness of breath.  Cardiovascular: Negative for chest pain.  Gastrointestinal: Positive for nausea. Negative for abdominal pain and vomiting.  Genitourinary: Negative for difficulty urinating (no incontinence).  Musculoskeletal: Positive for neck pain. Negative for arthralgias and myalgias.  Skin: Negative for color change and wound.  Allergic/Immunologic: Negative for immunocompromised state.  Neurological: Positive for headaches. Negative for syncope, weakness, light-headedness (no worse than baseline) and numbness.  Hematological: Does not bruise/bleed easily.  Psychiatric/Behavioral: Negative for confusion.     All other systems reviewed and are negative for acute change except as noted in the HPI.    Physical Exam Updated Vital Signs BP (!) 159/105 (BP Location: Left Arm)   Pulse 95   Temp 98.5 F (36.9 C) (Oral)   Resp 15   SpO2 97%   Physical Exam  Constitutional: He is oriented to person, place, and time. Vital signs are normal. He appears well-developed and well-nourished.  Non-toxic appearance. No distress.  Afebrile, nontoxic, NAD  HENT:  Head: Normocephalic and atraumatic. Head is without raccoon's eyes, without Battle's sign, without abrasion and without contusion.    Mouth/Throat: Oropharynx is clear and moist and mucous membranes are normal.  Mild TTP over L cheek/zygomatic arch area, no crepitus or deformity, no bruising/swelling, no raccoon eyes or battle's signs, no abrasions. No scalp deformity or skull depression. No s/sx of basilar skull fx. TMJs without tenderness and with FROM intact  Eyes: Pupils are equal, round, and reactive to light. Conjunctivae and EOM are normal. Right eye exhibits no discharge. Left eye exhibits no discharge.  PERRL, EOMI, no nystagmus, no visual field deficits   Neck: Normal range of motion. Neck supple. Spinous process tenderness and muscular tenderness present. No neck rigidity. Normal range of motion present.  FROM intact with diffuse midline spinous process TTP, no bony stepoffs or deformities, and with diffuse b/l paraspinous muscle TTP and muscle spasms. No rigidity or meningeal signs. No bruising or swelling.   Cardiovascular: Normal rate and intact distal pulses.  Pulmonary/Chest: Effort normal. No respiratory distress.  Abdominal: Normal appearance. He exhibits no distension.  Musculoskeletal: Normal range of motion.  MAE x4 Strength and sensation grossly intact in all extremities Distal pulses intact Gait steady  Neurological: He is alert and oriented to person, place, and time. He has normal strength. No cranial nerve deficit or  sensory deficit. Coordination and gait normal. GCS eye subscore is 4. GCS verbal subscore is 5. GCS motor subscore is 6.  CN 2-12 grossly intact A&O x4 GCS 15 Sensation and strength intact Gait nonataxic including with tandem walking Coordination with finger-to-nose WNL  Skin: Skin is warm, dry and intact. No rash noted.  Psychiatric: He has a normal mood and affect.  Nursing note and vitals reviewed.    ED Treatments / Results  Labs (all labs ordered are listed, but only abnormal results are displayed) Labs Reviewed - No data to display  EKG None  Radiology Ct Head Wo Contrast  Result Date: 11/24/2017 CLINICAL DATA:  Larey Seat and hit head on nightstand yesterday. LEFT cheek pain. History of hypertension, hypercholesterolemia. EXAM: CT HEAD WITHOUT CONTRAST CT MAXILLOFACIAL WITHOUT CONTRAST CT CERVICAL SPINE WITHOUT CONTRAST TECHNIQUE: Multidetector CT imaging of the head, cervical spine, and maxillofacial structures were performed using the standard protocol without intravenous contrast. Multiplanar CT image reconstructions of the cervical spine and maxillofacial structures were also generated. COMPARISON:  CT HEAD October 06, 2017 FINDINGS: CT HEAD FINDINGS BRAIN: The ventricles and sulci are normal. No intraparenchymal hemorrhage, mass effect nor midline shift. No  acute large vascular territory infarcts. No abnormal extra-axial fluid collections. Basal cisterns are patent. VASCULAR: Unremarkable. SKULL/SOFT TISSUES: No skull fracture. No significant soft tissue swelling. OTHER: None. CT MAXILLOFACIAL FINDINGS OSSEOUS: The mandible is intact, the condyles are located. No acute facial fracture. No destructive bony lesions. Poor dentition. ORBITS: Ocular globes and orbital contents are normal. SINUSES: Nasal septum is deviated to the LEFT, bilateral concha bullosa. Mastoid air cells are well aerated. SOFT TISSUES: Mild LEFT periorbital soft tissue swelling without subcutaneous gas or radiopaque  foreign bodies. No subcutaneous gas or radiopaque foreign bodies. CT CERVICAL SPINE FINDINGS ALIGNMENT: Cervical vertebral bodies in alignment. Straightened cervical lordosis. SKULL BASE AND VERTEBRAE: Cervical vertebral bodies and posterior elements are intact. Moderate C3-4, mild C4-5 disc height loss with endplate spurring compatible with degenerative discs. No destructive bony lesions. C1-2 articulation maintained. SOFT TISSUES AND SPINAL CANAL: Included prevertebral and paraspinal soft tissues are normal. DISC LEVELS: Mild canal stenosis C3-4, C4-5. Moderate bilateral C3-4 neural foraminal narrowing. UPPER CHEST: Lung apices are clear. OTHER: None. IMPRESSION: CT HEAD: 1. Normal noncontrast CT HEAD. CT MAXILLOFACIAL: 1. No acute facial fracture. CT CERVICAL SPINE: 1. No fracture or malalignment. 2. Mild C3-4 C4-5 canal stenosis. Moderate C3-4 neural foraminal narrowing. Electronically Signed   By: Awilda Metro M.D.   On: 11/24/2017 15:44   Ct Cervical Spine Wo Contrast  Result Date: 11/24/2017 CLINICAL DATA:  Larey Seat and hit head on nightstand yesterday. LEFT cheek pain. History of hypertension, hypercholesterolemia. EXAM: CT HEAD WITHOUT CONTRAST CT MAXILLOFACIAL WITHOUT CONTRAST CT CERVICAL SPINE WITHOUT CONTRAST TECHNIQUE: Multidetector CT imaging of the head, cervical spine, and maxillofacial structures were performed using the standard protocol without intravenous contrast. Multiplanar CT image reconstructions of the cervical spine and maxillofacial structures were also generated. COMPARISON:  CT HEAD October 06, 2017 FINDINGS: CT HEAD FINDINGS BRAIN: The ventricles and sulci are normal. No intraparenchymal hemorrhage, mass effect nor midline shift. No acute large vascular territory infarcts. No abnormal extra-axial fluid collections. Basal cisterns are patent. VASCULAR: Unremarkable. SKULL/SOFT TISSUES: No skull fracture. No significant soft tissue swelling. OTHER: None. CT MAXILLOFACIAL FINDINGS  OSSEOUS: The mandible is intact, the condyles are located. No acute facial fracture. No destructive bony lesions. Poor dentition. ORBITS: Ocular globes and orbital contents are normal. SINUSES: Nasal septum is deviated to the LEFT, bilateral concha bullosa. Mastoid air cells are well aerated. SOFT TISSUES: Mild LEFT periorbital soft tissue swelling without subcutaneous gas or radiopaque foreign bodies. No subcutaneous gas or radiopaque foreign bodies. CT CERVICAL SPINE FINDINGS ALIGNMENT: Cervical vertebral bodies in alignment. Straightened cervical lordosis. SKULL BASE AND VERTEBRAE: Cervical vertebral bodies and posterior elements are intact. Moderate C3-4, mild C4-5 disc height loss with endplate spurring compatible with degenerative discs. No destructive bony lesions. C1-2 articulation maintained. SOFT TISSUES AND SPINAL CANAL: Included prevertebral and paraspinal soft tissues are normal. DISC LEVELS: Mild canal stenosis C3-4, C4-5. Moderate bilateral C3-4 neural foraminal narrowing. UPPER CHEST: Lung apices are clear. OTHER: None. IMPRESSION: CT HEAD: 1. Normal noncontrast CT HEAD. CT MAXILLOFACIAL: 1. No acute facial fracture. CT CERVICAL SPINE: 1. No fracture or malalignment. 2. Mild C3-4 C4-5 canal stenosis. Moderate C3-4 neural foraminal narrowing. Electronically Signed   By: Awilda Metro M.D.   On: 11/24/2017 15:44   Ct Maxillofacial Wo Contrast  Result Date: 11/24/2017 CLINICAL DATA:  Larey Seat and hit head on nightstand yesterday. LEFT cheek pain. History of hypertension, hypercholesterolemia. EXAM: CT HEAD WITHOUT CONTRAST CT MAXILLOFACIAL WITHOUT CONTRAST CT CERVICAL SPINE WITHOUT CONTRAST TECHNIQUE: Multidetector CT  imaging of the head, cervical spine, and maxillofacial structures were performed using the standard protocol without intravenous contrast. Multiplanar CT image reconstructions of the cervical spine and maxillofacial structures were also generated. COMPARISON:  CT HEAD October 06, 2017  FINDINGS: CT HEAD FINDINGS BRAIN: The ventricles and sulci are normal. No intraparenchymal hemorrhage, mass effect nor midline shift. No acute large vascular territory infarcts. No abnormal extra-axial fluid collections. Basal cisterns are patent. VASCULAR: Unremarkable. SKULL/SOFT TISSUES: No skull fracture. No significant soft tissue swelling. OTHER: None. CT MAXILLOFACIAL FINDINGS OSSEOUS: The mandible is intact, the condyles are located. No acute facial fracture. No destructive bony lesions. Poor dentition. ORBITS: Ocular globes and orbital contents are normal. SINUSES: Nasal septum is deviated to the LEFT, bilateral concha bullosa. Mastoid air cells are well aerated. SOFT TISSUES: Mild LEFT periorbital soft tissue swelling without subcutaneous gas or radiopaque foreign bodies. No subcutaneous gas or radiopaque foreign bodies. CT CERVICAL SPINE FINDINGS ALIGNMENT: Cervical vertebral bodies in alignment. Straightened cervical lordosis. SKULL BASE AND VERTEBRAE: Cervical vertebral bodies and posterior elements are intact. Moderate C3-4, mild C4-5 disc height loss with endplate spurring compatible with degenerative discs. No destructive bony lesions. C1-2 articulation maintained. SOFT TISSUES AND SPINAL CANAL: Included prevertebral and paraspinal soft tissues are normal. DISC LEVELS: Mild canal stenosis C3-4, C4-5. Moderate bilateral C3-4 neural foraminal narrowing. UPPER CHEST: Lung apices are clear. OTHER: None. IMPRESSION: CT HEAD: 1. Normal noncontrast CT HEAD. CT MAXILLOFACIAL: 1. No acute facial fracture. CT CERVICAL SPINE: 1. No fracture or malalignment. 2. Mild C3-4 C4-5 canal stenosis. Moderate C3-4 neural foraminal narrowing. Electronically Signed   By: Awilda Metro M.D.   On: 11/24/2017 15:44    Procedures Procedures (including critical care time)  Medications Ordered in ED Medications  acetaminophen (TYLENOL) tablet 1,000 mg (1,000 mg Oral Given 11/24/17 1625)     Initial Impression /  Assessment and Plan / ED Course  I have reviewed the triage vital signs and the nursing notes.  Pertinent labs & imaging results that were available during my care of the patient were reviewed by me and considered in my medical decision making (see chart for details).     47 y.o. male here with fall yesterday and L cheek injury. Also c/o neck pain. On exam, no focal neuro deficits, mild L cheek tenderness, diffuse midline C-spine and paracervical muscle TTP and spasm. Pt has had lightheadedness issues for a few months, states he lost his balance which lead to the fall. Denies worsening lightheadedness/dizziness compared to baseline. Will get CT head/neck/face to ensure no injury. Will give tylenol, pt declines wanting anything else at this time. Will reassess shortly.   4:58 PM CT head/face/neck without acute finding, some mild C3-4/4-5 canal stenosis and moderate C3-4 neural foraminal narrowing but otherwise nothing else found on CT imaging. Likely just mild concussion and facial contusion. Advised concussion guidelines with mental rest, alternating tylenol/ibuprofen for pain, will rx zofran for nausea and flexeril for muscle spasms. Discussed use of ice/heat. F/up with PCP in 5-7 days for recheck of symptoms. Strict return precautions advised. I explained the diagnosis and have given explicit precautions to return to the ER including for any other new or worsening symptoms. The patient understands and accepts the medical plan as it's been dictated and I have answered their questions. Discharge instructions concerning home care and prescriptions have been given. The patient is STABLE and is discharged to home in good condition.    Final Clinical Impressions(s) / ED Diagnoses   Final  diagnoses:  Contusion of face, initial encounter  Concussion without loss of consciousness, initial encounter  Neck pain  Neck muscle spasm    ED Discharge Orders        Ordered    ondansetron (ZOFRAN ODT) 4 MG  disintegrating tablet  Every 8 hours PRN     11/24/17 1554    cyclobenzaprine (FLEXERIL) 10 MG tablet  3 times daily PRN     11/24/17 [address], [place], [place] 11/24/17 1700    Derwood Kaplan, MD 11/25/17 [phone]

## 2017-11-24 NOTE — ED Triage Notes (Signed)
Pt states he fell and hit his head on his nightstand yesterday, hitting his left cheek bone. Pt has pain in left cheek. Pt denies loss of consciousness.

## 2017-12-16 ENCOUNTER — Emergency Department (HOSPITAL_COMMUNITY)
Admission: EM | Admit: 2017-12-16 | Discharge: 2017-12-16 | Disposition: A | Discharge status: Home / Self Care | Payer: Managed Care, Other (non HMO) | Attending: Emergency Medicine | Admitting: Emergency Medicine

## 2017-12-16 ENCOUNTER — Encounter (HOSPITAL_COMMUNITY): Payer: Self-pay | Admitting: Emergency Medicine

## 2017-12-16 DIAGNOSIS — K529 Noninfective gastroenteritis and colitis, unspecified: Secondary | ICD-10-CM | POA: Diagnosis not present

## 2017-12-16 DIAGNOSIS — Z79899 Other long term (current) drug therapy: Secondary | ICD-10-CM | POA: Insufficient documentation

## 2017-12-16 DIAGNOSIS — R112 Nausea with vomiting, unspecified: Secondary | ICD-10-CM | POA: Diagnosis present

## 2017-12-16 LAB — CBC WITH DIFFERENTIAL/PLATELET
BASOS ABS: 0 10*3/uL (ref 0.0–0.1)
Basophils Relative: 0 %
EOS PCT: 1 %
Eosinophils Absolute: 0.1 10*3/uL (ref 0.0–0.7)
HEMATOCRIT: 45.7 % (ref 39.0–52.0)
HEMOGLOBIN: 14.8 g/dL (ref 13.0–17.0)
LYMPHS ABS: 0.8 10*3/uL (ref 0.7–4.0)
LYMPHS PCT: 7 %
MCH: 26.5 pg (ref 26.0–34.0)
MCHC: 32.4 g/dL (ref 30.0–36.0)
MCV: 81.8 fL (ref 78.0–100.0)
Monocytes Absolute: 0.9 10*3/uL (ref 0.1–1.0)
Monocytes Relative: 8 %
NEUTROS ABS: 9.7 10*3/uL — AB (ref 1.7–7.7)
NEUTROS PCT: 84 %
PLATELETS: 227 10*3/uL (ref 150–400)
RBC: 5.59 MIL/uL (ref 4.22–5.81)
RDW: 14.7 % (ref 11.5–15.5)
WBC: 11.4 10*3/uL — AB (ref 4.0–10.5)

## 2017-12-16 LAB — COMPREHENSIVE METABOLIC PANEL
ALT: 33 U/L (ref 17–63)
ANION GAP: 11 (ref 5–15)
AST: 20 U/L (ref 15–41)
Albumin: 4.1 g/dL (ref 3.5–5.0)
Alkaline Phosphatase: 75 U/L (ref 38–126)
BUN: 11 mg/dL (ref 6–20)
CO2: 26 mmol/L (ref 22–32)
Calcium: 9.1 mg/dL (ref 8.9–10.3)
Chloride: 103 mmol/L (ref 101–111)
Creatinine, Ser: 1.46 mg/dL — ABNORMAL HIGH (ref 0.61–1.24)
GFR, EST NON AFRICAN AMERICAN: 56 mL/min — AB (ref >60)
Glucose, Bld: 114 mg/dL — ABNORMAL HIGH (ref 65–99)
POTASSIUM: 3.7 mmol/L (ref 3.5–5.1)
Sodium: 140 mmol/L (ref 135–145)
Total Bilirubin: 1.4 mg/dL — ABNORMAL HIGH (ref 0.3–1.2)
Total Protein: 7.9 g/dL (ref 6.5–8.1)

## 2017-12-16 LAB — LIPASE, BLOOD: Lipase: 25 U/L (ref 11–51)

## 2017-12-16 MED ORDER — ONDANSETRON HCL 4 MG/2ML IJ SOLN
4.0000 mg | Freq: Once | INTRAMUSCULAR | Status: AC
  Administered 2017-12-16: 4 mg via INTRAVENOUS
  Filled 2017-12-16: qty 2

## 2017-12-16 MED ORDER — ONDANSETRON 4 MG PO TBDP: 4.0000 mg | ORAL_TABLET | Freq: Three times a day (TID) | ORAL | 0 refills | Status: DC | PRN

## 2017-12-16 MED ORDER — SODIUM CHLORIDE 0.9 % IV BOLUS
1000.0000 mL | Freq: Once | INTRAVENOUS | Status: AC
  Administered 2017-12-16: 1000 mL via INTRAVENOUS

## 2017-12-16 NOTE — ED Notes (Signed)
Bed: WA05 Expected date:  Expected time:  Means of arrival:  Comments: 

## 2017-12-16 NOTE — ED Triage Notes (Addendum)
Patient here from home with complaints of chills, nausea, vomiting that started yesterday morning. Also reports fever, Tylenol with relief.

## 2017-12-16 NOTE — Discharge Instructions (Addendum)
Clear liquids.  Slowly advance her diet. Zofran for nausea.

## 2017-12-16 NOTE — ED Provider Notes (Signed)
[facility]-EMERGENCY DEPT Provider Note   CSN: [id number] Arrival date & time: 12/16/17  4098     History   Chief Complaint Chief Complaint  Patient presents with  . Chills  . Nausea  . Emesis    HPI Jacob Freeman is a 47 y.o. male.  Chief complaint is vomiting.  HPI 47 year old male with history of reflux.  Nausea and vomiting since awakening this morning.  Cannot keep down water and starting to feel worse with weakness and lightheadedness.  He presents here.  Heme-negative nonbilious emesis.  No diarrhea.  No localized abdominal pain.  Some cramping.  Past Medical History:  Diagnosis Date  . Chest pain, unspecified   . Esophageal reflux   . Hematuria   . Hypercholesteremia   . Hypertension   . Nephrolithiasis   . Pain in joint, hand     There are no active problems to display for this patient.   Past Surgical History:  Procedure Laterality Date  . TONSILLECTOMY          Home Medications    Prior to Admission medications   Medication Sig Start Date End Date Taking? Authorizing Provider  amitriptyline (ELAVIL) 50 MG tablet Take 50 mg by mouth at bedtime.  10/12/17  Yes [provider]  amLODipine (NORVASC) 10 MG tablet Take 1 tablet (10 mg total) by mouth daily. 08/26/17  Yes Bing Neighbors, FNP  cyclobenzaprine (FLEXERIL) 10 MG tablet Take 1 tablet (10 mg total) by mouth 3 (three) times daily as needed for muscle spasms. 11/24/17  Yes Street, [place], PA-C  gabapentin (NEURONTIN) 100 MG capsule Take 100 mg by mouth 2 (two) times daily.  11/19/17  Yes [provider]  ibuprofen (ADVIL,MOTRIN) 200 MG tablet Take 800 mg by mouth every 6 (six) hours as needed for mild pain.    Yes [provider]  meclizine (ANTIVERT) 25 MG tablet Take 25 mg by mouth 3 (three) times daily.  10/12/17  Yes [provider]  cyclobenzaprine (FLEXERIL) 10 MG tablet Take 1 tablet (10 mg total) by mouth 3 (three) times daily as  needed for muscle spasms. Patient not taking: Reported on 11/24/2017 10/17/17   Bing Neighbors, FNP  ondansetron (ZOFRAN ODT) 4 MG disintegrating tablet Take 1 tablet (4 mg total) by mouth every 8 (eight) hours as needed for nausea. 12/16/17   Rolland Porter, MD    Family History Family History  Problem Relation Age of Onset  . Brain cancer Father   . Hypertension Mother     Social History Social History   Tobacco Use  . Smoking status: Never Smoker  . Smokeless tobacco: Never Used  Substance Use Topics  . Alcohol use: No  . Drug use: No     Allergies   Patient has no known allergies.   Review of Systems Review of Systems  Constitutional: Negative for appetite change, chills, diaphoresis, fatigue and fever.  HENT: Negative for mouth sores, sore throat and trouble swallowing.   Eyes: Negative for visual disturbance.  Respiratory: Negative for cough, chest tightness, shortness of breath and wheezing.   Cardiovascular: Negative for chest pain.  Gastrointestinal: Positive for nausea. Negative for abdominal distention, abdominal pain, diarrhea and vomiting.  Endocrine: Negative for polydipsia, polyphagia and polyuria.  Genitourinary: Negative for dysuria, frequency and hematuria.  Musculoskeletal: Negative for gait problem.  Skin: Negative for color change, pallor and rash.  Neurological: Negative for dizziness, syncope, light-headedness and headaches.  Hematological: Does not bruise/bleed easily.  Psychiatric/Behavioral: Negative for behavioral problems and confusion.     Physical Exam Updated Vital Signs BP (!) 150/89 (BP Location: Left Arm)   Pulse (!) 119   Temp 99 F (37.2 C) (Oral)   Resp 20   SpO2 98%   Physical Exam  Constitutional: He is oriented to person, place, and time. He appears well-developed and well-nourished. No distress.  HENT:  Head: Normocephalic.  Eyes: Pupils are equal, round, and reactive to light. Conjunctivae are normal. No scleral  icterus.  Neck: Normal range of motion. Neck supple. No thyromegaly present.  Cardiovascular: Normal rate and regular rhythm. Exam reveals no gallop and no friction rub.  No murmur heard. Pulmonary/Chest: Effort normal and breath sounds normal. No respiratory distress. He has no wheezes. He has no rales.  Abdominal: Soft. Bowel sounds are normal. He exhibits no distension. There is no tenderness. There is no rebound.  Musculoskeletal: Normal range of motion.  Neurological: He is alert and oriented to person, place, and time.  Skin: Skin is warm and dry. No rash noted.  Psychiatric: He has a normal mood and affect. His behavior is normal.     ED Treatments / Results  Labs (all labs ordered are listed, but only abnormal results are displayed) Labs Reviewed  COMPREHENSIVE METABOLIC PANEL - Abnormal; Notable for the following components:      Result Value   Glucose, Bld 114 (*)    Creatinine, Ser 1.46 (*)    Total Bilirubin 1.4 (*)    GFR calc non Af Amer 56 (*)    All other components within normal limits  CBC WITH DIFFERENTIAL/PLATELET - Abnormal; Notable for the following components:   WBC 11.4 (*)    Neutro Abs 9.7 (*)    All other components within normal limits  LIPASE, BLOOD    EKG None  Radiology No results found.  Procedures Procedures (including critical care time)  Medications Ordered in ED Medications  sodium chloride 0.9 % bolus 1,000 mL (1,000 mLs Intravenous New Bag/Given 12/16/17 1052)  ondansetron (ZOFRAN) injection 4 mg (4 mg Intravenous Given 12/16/17 1053)     Initial Impression / Assessment and Plan / ED Course  I have reviewed the triage vital signs and the nursing notes.  Pertinent labs & imaging results that were available during my care of the patient were reviewed by me and considered in my medical decision making (see chart for details).    Even IV fluids and antiemetics.  Reassuring labs.  Feeling well.  Discharged home.  Final Clinical  Impressions(s) / ED Diagnoses   Final diagnoses:  Gastroenteritis    ED Discharge Orders        Ordered    ondansetron (ZOFRAN ODT) 4 MG disintegrating tablet  Every 8 hours PRN     12/16/17 1340       Rolland Porter, MD 12/16/17 1342

## 2018-02-23 ENCOUNTER — Ambulatory Visit (INDEPENDENT_AMBULATORY_CARE_PROVIDER_SITE_OTHER): Payer: Managed Care, Other (non HMO) | Admitting: Family Medicine

## 2018-02-23 ENCOUNTER — Encounter: Payer: Self-pay | Admitting: Family Medicine

## 2018-02-23 VITALS — BP 118/76 | HR 74 | Temp 97.8°F | Ht 71.0 in | Wt 266.0 lb

## 2018-02-23 DIAGNOSIS — R8290 Unspecified abnormal findings in urine: Secondary | ICD-10-CM | POA: Diagnosis not present

## 2018-02-23 DIAGNOSIS — M542 Cervicalgia: Secondary | ICD-10-CM | POA: Diagnosis not present

## 2018-02-23 DIAGNOSIS — Z09 Encounter for follow-up examination after completed treatment for conditions other than malignant neoplasm: Secondary | ICD-10-CM

## 2018-02-23 DIAGNOSIS — I10 Essential (primary) hypertension: Secondary | ICD-10-CM

## 2018-02-23 DIAGNOSIS — N390 Urinary tract infection, site not specified: Secondary | ICD-10-CM | POA: Diagnosis not present

## 2018-02-23 LAB — POCT URINALYSIS DIP (MANUAL ENTRY)
Bilirubin, UA: NEGATIVE
Blood, UA: NEGATIVE
Glucose, UA: NEGATIVE mg/dL
Ketones, POC UA: NEGATIVE mg/dL
Nitrite, UA: NEGATIVE
Protein Ur, POC: NEGATIVE mg/dL
Spec Grav, UA: 1.015 (ref 1.010–1.025)
Urobilinogen, UA: 0.2 E.U./dL
pH, UA: 6.5 (ref 5.0–8.0)

## 2018-02-23 LAB — POCT GLYCOSYLATED HEMOGLOBIN (HGB A1C): Hemoglobin A1C: 5.8 % — AB (ref 4.0–5.6)

## 2018-02-23 MED ORDER — SULFAMETHOXAZOLE-TRIMETHOPRIM 800-160 MG PO TABS: 1.0000 | ORAL_TABLET | Freq: Two times a day (BID) | ORAL | 0 refills | Status: DC

## 2018-02-23 NOTE — Progress Notes (Addendum)
Follow Up  Subjective:    Patient ID: Jacob Freeman, male    DOB: 04/30/1971, 47 y.o.   MRN: [id number]   Chief Complaint  Patient presents with  . Follow-up    on HTN    HPI  Mr. Earlene PlaterDavis has a past medical history of Hypertension, and Hypercholesteremia.   Current Status: Since his last office visit, he is doing well with no complaints. He continues to take Meclizine for dizziness and occasional Motrin for his headaches. He denies fevers, chills, fatigue, recent infections, weight loss, and night sweats. He has not had any visual changes, and falls. No chest pain, heart palpitations, cough and shortness of breath reported. No reports of GI problems such as nausea, vomiting, diarrhea, and constipation. He has no reports of blood in stools, dysuria and hematuria. No depression or anxiety, and denies suicidal ideations, homicidal ideations, or auditory hallucinations. He reports mild neck pain today.   Past Medical History:  Diagnosis Date  . Chest pain, unspecified   . Esophageal reflux   . Hematuria   . Hypercholesteremia   . Hypertension   . Nephrolithiasis   . Pain in joint, hand     Family History  Problem Relation Age of Onset  . Brain cancer Father   . Hypertension Mother     Social History   Socioeconomic History  . Marital status: Married    Spouse name: Not on file  . Number of children: Not on file  . Years of education: Not on file  . Highest education level: Not on file  Occupational History  . Not on file  Social Needs  . Financial resource strain: Not on file  . Food insecurity:    Worry: Not on file    Inability: Not on file  . Transportation needs:    Medical: Not on file    Non-medical: Not on file  Tobacco Use  . Smoking status: Never Smoker  . Smokeless tobacco: Never Used  Substance and Sexual Activity  . Alcohol use: No  . Drug use: No  . Sexual activity: Not on file  Lifestyle  . Physical activity:    Days per week: Not on file    Minutes  per session: Not on file  . Stress: Not on file  Relationships  . Social connections:    Talks on phone: Not on file    Gets together: Not on file    Attends religious service: Not on file    Active member of club or organization: Not on file    Attends meetings of clubs or organizations: Not on file    Relationship status: Not on file  . Intimate partner violence:    Fear of current or ex partner: Not on file    Emotionally abused: Not on file    Physically abused: Not on file    Forced sexual activity: Not on file  Other Topics Concern  . Not on file  Social History Narrative  . Not on file    Past Surgical History:  Procedure Laterality Date  . TONSILLECTOMY      There is no immunization history on file for this patient.  Current Meds  Medication Sig  . amitriptyline (ELAVIL) 50 MG tablet Take 50 mg by mouth at bedtime.   Marland Kitchen. amLODipine (NORVASC) 10 MG tablet Take 1 tablet (10 mg total) by mouth daily.  . cyclobenzaprine (FLEXERIL) 10 MG tablet Take 1 tablet (10 mg total) by mouth 3 (three) times daily as  needed for muscle spasms.  Marland Kitchen ibuprofen (ADVIL,MOTRIN) 200 MG tablet Take 800 mg by mouth every 6 (six) hours as needed for mild pain.   . meclizine (ANTIVERT) 25 MG tablet Take 25 mg by mouth 3 (three) times daily.   . ondansetron (ZOFRAN ODT) 4 MG disintegrating tablet Take 1 tablet (4 mg total) by mouth every 8 (eight) hours as needed for nausea.  . [DISCONTINUED] cyclobenzaprine (FLEXERIL) 10 MG tablet Take 1 tablet (10 mg total) by mouth 3 (three) times daily as needed for muscle spasms.    No Known Allergies  BP 118/76 (BP Location: Left Arm, Patient Position: Sitting, Cuff Size: Large)   Pulse 74   Temp 97.8 F (36.6 C) (Oral)   Ht 5\' 11"  (1.803 m)   Wt 266 lb (120.7 kg)   SpO2 99%   BMI 37.10 kg/m   Review of Systems  Constitutional: Negative.   HENT: Negative.   Eyes: Negative.   Respiratory: Negative.   Cardiovascular: Negative.   Gastrointestinal:  Negative.   Endocrine: Negative.   Genitourinary: Negative.   Musculoskeletal: Positive for neck pain.  Skin: Negative.   Allergic/Immunologic: Negative.   Neurological: Positive for dizziness and headaches (Occasional).  Hematological: Negative.   Psychiatric/Behavioral: Negative.    Objective:   Physical Exam  Constitutional: He is oriented to person, place, and time. He appears well-developed and well-nourished.  HENT:  Head: Normocephalic.  Right Ear: External ear normal.  Left Ear: External ear normal.  Nose: Nose normal.  Mouth/Throat: Oropharynx is clear and moist.  Eyes: Pupils are equal, round, and reactive to light. Conjunctivae are normal.  Neck: Normal range of motion. Neck supple.  Cardiovascular: Normal rate, regular rhythm, normal heart sounds and intact distal pulses.  Pulmonary/Chest: Effort normal and breath sounds normal.  Abdominal: Soft. Bowel sounds are normal.  Musculoskeletal: Normal range of motion.  Neurological: He is alert and oriented to person, place, and time.  Skin: Skin is warm and dry. Capillary refill takes less than 2 seconds.  Psychiatric: He has a normal mood and affect. His behavior is normal. Judgment and thought content normal.  Nursing note and vitals reviewed.  Assessment & Plan:   1. Essential hypertension Blood pressure is stable at 118/76 today. He will continue taking Amlodipine as prescribed.  - POCT urinalysis dipstick - POCT glycosylated hemoglobin (Hb A1C)  2. Neck pain Stable. He continues taking Flexeril and occasional Ibuprofen for neck pain.   3. Urinary tract infection without hematuria, site unspecified - sulfamethoxazole-trimethoprim (BACTRIM DS,SEPTRA DS) 800-160 MG tablet; Take 1 tablet by mouth 2 (two) times daily.  Dispense: 14 tablet; Refill: 0  4. Abnormal urinalysis - Urine Culture  5. Follow up He will follow up in 6 months.   Meds ordered this encounter  Medications  . sulfamethoxazole-trimethoprim  (BACTRIM DS,SEPTRA DS) 800-160 MG tablet    Sig: Take 1 tablet by mouth 2 (two) times daily.    Dispense:  14 tablet    Refill:  0    Raliegh Ip,  MSN, FNP-C [facility] [address] [place], [place] [address] [phone]

## 2018-02-25 LAB — URINE CULTURE: Organism ID, Bacteria: NO GROWTH

## 2018-08-26 ENCOUNTER — Encounter: Payer: Self-pay | Admitting: Family Medicine

## 2018-08-26 ENCOUNTER — Ambulatory Visit (INDEPENDENT_AMBULATORY_CARE_PROVIDER_SITE_OTHER): Payer: Managed Care, Other (non HMO) | Admitting: Family Medicine

## 2018-08-26 VITALS — BP 134/90 | HR 80 | Temp 97.5°F | Ht 71.0 in | Wt 273.0 lb

## 2018-08-26 DIAGNOSIS — Z131 Encounter for screening for diabetes mellitus: Secondary | ICD-10-CM

## 2018-08-26 DIAGNOSIS — Z23 Encounter for immunization: Secondary | ICD-10-CM | POA: Diagnosis not present

## 2018-08-26 DIAGNOSIS — I10 Essential (primary) hypertension: Secondary | ICD-10-CM | POA: Diagnosis not present

## 2018-08-26 DIAGNOSIS — R8290 Unspecified abnormal findings in urine: Secondary | ICD-10-CM

## 2018-08-26 DIAGNOSIS — E66812 Obesity, class 2: Secondary | ICD-10-CM | POA: Insufficient documentation

## 2018-08-26 DIAGNOSIS — Z6838 Body mass index (BMI) 38.0-38.9, adult: Secondary | ICD-10-CM

## 2018-08-26 DIAGNOSIS — Z09 Encounter for follow-up examination after completed treatment for conditions other than malignant neoplasm: Secondary | ICD-10-CM

## 2018-08-26 LAB — POCT URINALYSIS DIP (MANUAL ENTRY)
Bilirubin, UA: NEGATIVE
Glucose, UA: NEGATIVE mg/dL
Ketones, POC UA: NEGATIVE mg/dL
Leukocytes, UA: NEGATIVE
Nitrite, UA: NEGATIVE
Protein Ur, POC: NEGATIVE mg/dL
Spec Grav, UA: 1.015 (ref 1.010–1.025)
Urobilinogen, UA: 0.2 E.U./dL
pH, UA: 6.5 (ref 5.0–8.0)

## 2018-08-26 LAB — POCT GLYCOSYLATED HEMOGLOBIN (HGB A1C): Hemoglobin A1C: 5.6 % (ref 4.0–5.6)

## 2018-08-26 NOTE — Progress Notes (Signed)
[facility]  Established Patient Office Visit  Subjective:  Patient ID: Jacob Freeman, male    DOB: Apr 20, 1971  Age: 48 y.o. MRN: [id number]  CC:  Chief Complaint  Patient presents with  . Follow-up    Chronic condtion     HPI Jacob Freeman is a 48 year old male who presents for follow up of chronic diseases.  Past Medical History:  Diagnosis Date  . Chest pain, unspecified   . Esophageal reflux   . Hematuria   . Hypercholesteremia   . Hypertension   . Nephrolithiasis   . Pain in joint, hand    Current Status: Since his last office visit, he is doing well with no complaints. He denies visual changes, chest pain, cough, shortness of breath, heart palpitations, and falls. He has occasionally headaches and dizziness with position changes. Denies severe headaches, confusion, seizures, double vision, and blurred vision, nausea and vomiting. He is a [employer] architect and continues to look for ways to stay active for weight loss.   He denies fevers, chills, fatigue, recent infections, weight loss, and night sweats. No reports of GI problems such as diarrhea, and constipation. He has no reports of blood in stools, dysuria and hematuria. No depression or anxiety reported. He denies pain today.   Past Surgical History:  Procedure Laterality Date  . TONSILLECTOMY      Family History  Problem Relation Age of Onset  . Brain cancer Father   . Hypertension Mother     Social History   Socioeconomic History  . Marital status: Married    Spouse name: Not on file  . Number of children: Not on file  . Years of education: Not on file  . Highest education level: Not on file  Occupational History  . Not on file  Social Needs  . Financial resource strain: Not on file  . Food insecurity:    Worry: Not on file    Inability: Not on file  . Transportation needs:    Medical: Not on file    Non-medical: Not on file  Tobacco Use  . Smoking  status: Never Smoker  . Smokeless tobacco: Never Used  Substance and Sexual Activity  . Alcohol use: No  . Drug use: No  . Sexual activity: Not on file  Lifestyle  . Physical activity:    Days per week: Not on file    Minutes per session: Not on file  . Stress: Not on file  Relationships  . Social connections:    Talks on phone: Not on file    Gets together: Not on file    Attends religious service: Not on file    Active member of club or organization: Not on file    Attends meetings of clubs or organizations: Not on file    Relationship status: Not on file  . Intimate partner violence:    Fear of current or ex partner: Not on file    Emotionally abused: Not on file    Physically abused: Not on file    Forced sexual activity: Not on file  Other Topics Concern  . Not on file  Social History Narrative  . Not on file    Outpatient Medications Prior to Visit  Medication Sig Dispense Refill  . amLODipine (NORVASC) 10 MG tablet Take 1 tablet (10 mg total) by mouth daily. 90 tablet 3  . amitriptyline (ELAVIL) 50 MG tablet Take 50 mg by mouth at  bedtime.     . cyclobenzaprine (FLEXERIL) 10 MG tablet Take 1 tablet (10 mg total) by mouth 3 (three) times daily as needed for muscle spasms. (Patient not taking: Reported on 08/26/2018) 15 tablet 0  . gabapentin (NEURONTIN) 100 MG capsule Take 100 mg by mouth 2 (two) times daily.   2  . ibuprofen (ADVIL,MOTRIN) 200 MG tablet Take 800 mg by mouth every 6 (six) hours as needed for mild pain.     . meclizine (ANTIVERT) 25 MG tablet Take 25 mg by mouth 3 (three) times daily.     . ondansetron (ZOFRAN ODT) 4 MG disintegrating tablet Take 1 tablet (4 mg total) by mouth every 8 (eight) hours as needed for nausea. (Patient not taking: Reported on 08/26/2018) 6 tablet 0  . sulfamethoxazole-trimethoprim (BACTRIM DS,SEPTRA DS) 800-160 MG tablet Take 1 tablet by mouth 2 (two) times daily. (Patient not taking: Reported on 08/26/2018) 14 tablet 0   No  facility-administered medications prior to visit.     No Known Allergies  ROS Review of Systems  Constitutional: Negative.   HENT: Negative.   Eyes: Negative.   Respiratory: Negative.   Cardiovascular: Negative.   Gastrointestinal: Positive for abdominal distention (distention).  Endocrine: Negative.   Genitourinary: Negative.   Musculoskeletal: Negative.   Skin: Negative.   Neurological: Positive for dizziness and headaches.  Hematological: Negative.   Psychiatric/Behavioral: Negative.    Objective:   Physical Exam  Constitutional: He is oriented to person, place, and time. He appears well-developed and well-nourished.  HENT:  Head: Normocephalic and atraumatic.  Eyes: Conjunctivae are normal.  Neck: Neck supple.  Cardiovascular: Normal rate and regular rhythm.  Pulmonary/Chest: Effort normal and breath sounds normal.  Abdominal: Soft. Bowel sounds are normal. He exhibits distension (distention).  Musculoskeletal: Normal range of motion.  Neurological: He is alert and oriented to person, place, and time. He has normal reflexes.  Skin: Skin is warm and dry.  Psychiatric: He has a normal mood and affect. His behavior is normal. Judgment and thought content normal.  Vitals reviewed.   BP 134/90 (BP Location: Left Arm, Patient Position: Sitting, Cuff Size: Large)   Pulse 80   Temp (!) 97.5 F (36.4 C) (Oral)   Ht 5\' 11"  (1.803 m)   Wt 273 lb (123.8 kg)   SpO2 97%   BMI 38.08 kg/m  Wt Readings from Last 3 Encounters:  08/26/18 273 lb (123.8 kg)  02/23/18 266 lb (120.7 kg)  10/17/17 265 lb (120.2 kg)     Health Maintenance Due  Topic Date Due  . HIV Screening  10/27/1985  . INFLUENZA VACCINE  02/26/2018    There are no preventive care reminders to display for this patient.  Lab Results  Component Value Date   TSH 2.110 09/05/2017   Lab Results  Component Value Date   WBC 11.4 (H) 12/16/2017   HGB 14.8 12/16/2017   HCT 45.7 12/16/2017   MCV 81.8  12/16/2017   PLT 227 12/16/2017   Lab Results  Component Value Date   NA 140 12/16/2017   K 3.7 12/16/2017   CO2 26 12/16/2017   GLUCOSE 114 (H) 12/16/2017   BUN 11 12/16/2017   CREATININE 1.46 (H) 12/16/2017   BILITOT 1.4 (H) 12/16/2017   ALKPHOS 75 12/16/2017   AST 20 12/16/2017   ALT 33 12/16/2017   PROT 7.9 12/16/2017   ALBUMIN 4.1 12/16/2017   CALCIUM 9.1 12/16/2017   ANIONGAP 11 12/16/2017   Lab Results  Component Value  Date   CHOL 182 09/05/2017   Lab Results  Component Value Date   HDL 55 09/05/2017   Lab Results  Component Value Date   LDLCALC 114 (H) 09/05/2017   Lab Results  Component Value Date   TRIG 66 09/05/2017   Lab Results  Component Value Date   CHOLHDL 3.3 09/05/2017   Lab Results  Component Value Date   HGBA1C 5.6 08/26/2018   Assessment & Plan:    1. Essential hypertension Blood pressure is stable at 134/90 today. He will continue to decrease high sodium intake, excessive alcohol intake, increase potassium intake, smoking cessation, and increase physical activity of at least 30 minutes of cardio activity daily. He will continue to follow Heart Healthy or DASH diet.  2. Class 2 severe obesity with serious comorbidity and body mass index (BMI) of 38.0 to 38.9 in adult, unspecified obesity type (HCC) Body mass index is 38.08 kg/m.  Goal BMI  is <30. Encouraged efforts to reduce weight include engaging in physical activity as tolerated with goal of 150 minutes per week. Improve dietary choices and eat a meal regimen consistent with a Mediterranean or DASH diet. Reduce simple carbohydrates. Do not skip meals and eat healthy snacks throughout the day to avoid over-eating at dinner. Set a goal weight loss that is achievable for you.  3. Screening for diabetes mellitus Hgb A1c is within normal range of 5.6 today. He will continue to decrease foods/beverages high in sugars and carbs and follow Heart Healthy or DASH diet. Increase physical activity to  at least 30 minutes cardio exercise daily.  - POCT glycosylated hemoglobin (Hb A1C) - POCT urinalysis dipstick  4. Abnormal urinalysis Results are pending. - Urine Culture  5. Need for immunization against influenza - Flu Vaccine QUAD 36+ mos IM  6. Follow up He will follow up in 6 months. We will draw labs at next office visit (including HIV). Physician's excuse written today.   No orders of the defined types were placed in this encounter.  Raliegh Ip,  MSN, FNP-C [facility] [address] [place], [place] [address] [phone]   Problem List Items Addressed This Visit    None    Visit Diagnoses    Essential hypertension    -  Primary   Class 2 severe obesity with serious comorbidity and body mass index (BMI) of 38.0 to 38.9 in adult, unspecified obesity type (HCC)       Screening for diabetes mellitus       Relevant Orders   POCT glycosylated hemoglobin (Hb A1C) (Completed)   POCT urinalysis dipstick (Completed)   Abnormal urinalysis       Relevant Orders   Urine Culture   Need for immunization against influenza       Relevant Orders   Flu Vaccine QUAD 36+ mos IM (Completed)   Follow up          No orders of the defined types were placed in this encounter.   Follow-up: Return in about 6 months (around 02/24/2019).    Kallie Locks, FNP

## 2018-08-26 NOTE — Patient Instructions (Signed)
Heart-Healthy Eating Plan Heart-healthy meal planning includes:  Eating less unhealthy fats.  Eating more healthy fats.  Making other changes in your diet. Talk with your doctor or a diet specialist (dietitian) to create an eating plan that is right for you. What is my plan? Your doctor may recommend an eating plan that includes:  Total fat: ______% or less of total calories a day.  Saturated fat: ______% or less of total calories a day.  Cholesterol: less than _________mg a day. What are tips for following this plan? Cooking Avoid frying your food. Try to bake, boil, grill, or broil it instead. You can also reduce fat by:  Removing the skin from poultry.  Removing all visible fats from meats.  Steaming vegetables in water or broth. Meal planning   At meals, divide your plate into four equal parts: ? Fill one-half of your plate with vegetables and green salads. ? Fill one-fourth of your plate with whole grains. ? Fill one-fourth of your plate with lean protein foods.  Eat 4-5 servings of vegetables per day. A serving of vegetables is: ? 1 cup of raw or cooked vegetables. ? 2 cups of raw leafy greens.  Eat 4-5 servings of fruit per day. A serving of fruit is: ? 1 medium whole fruit. ?  cup of dried fruit. ?  cup of fresh, frozen, or canned fruit. ?  cup of 100% fruit juice.  Eat more foods that have soluble fiber. These are apples, broccoli, carrots, beans, peas, and barley. Try to get 20-30 g of fiber per day.  Eat 4-5 servings of nuts, legumes, and seeds per week: ? 1 serving of dried beans or legumes equals  cup after being cooked. ? 1 serving of nuts is  cup. ? 1 serving of seeds equals 1 tablespoon. General information  Eat more home-cooked food. Eat less restaurant, buffet, and fast food.  Limit or avoid alcohol.  Limit foods that are high in starch and sugar.  Avoid fried foods.  Lose weight if you are overweight.  Keep track of how much salt  (sodium) you eat. This is important if you have high blood pressure. Ask your doctor to tell you more about this.  Try to add vegetarian meals each week. Fats  Choose healthy fats. These include olive oil and canola oil, flaxseeds, walnuts, almonds, and seeds.  Eat more omega-3 fats. These include salmon, mackerel, sardines, tuna, flaxseed oil, and ground flaxseeds. Try to eat fish at least 2 times each week.  Check food labels. Avoid foods with trans fats or high amounts of saturated fat.  Limit saturated fats. ? These are often found in animal products, such as meats, butter, and cream. ? These are also found in plant foods, such as palm oil, palm kernel oil, and coconut oil.  Avoid foods with partially hydrogenated oils in them. These have trans fats. Examples are stick margarine, some tub margarines, cookies, crackers, and other baked goods. What foods can I eat? Fruits All fresh, canned (in natural juice), or frozen fruits. Vegetables Fresh or frozen vegetables (raw, steamed, roasted, or grilled). Green salads. Grains Most grains. Choose whole wheat and whole grains most of the time. Rice and pasta, including brown rice and pastas made with whole wheat. Meats and other proteins Lean, well-trimmed beef, veal, pork, and lamb. Chicken and turkey without skin. All fish and shellfish. Wild duck, rabbit, pheasant, and venison. Egg whites or low-cholesterol egg substitutes. Dried beans, peas, lentils, and tofu. Seeds and most   nuts. Dairy Low-fat or nonfat cheeses, including ricotta and mozzarella. Skim or 1% milk that is liquid, powdered, or evaporated. Buttermilk that is made with low-fat milk. Nonfat or low-fat yogurt. Fats and oils Non-hydrogenated (trans-free) margarines. Vegetable oils, including soybean, sesame, sunflower, olive, peanut, safflower, corn, canola, and cottonseed. Salad dressings or mayonnaise made with a vegetable oil. Beverages Mineral water. Coffee and tea. Diet  carbonated beverages. Sweets and desserts Sherbet, gelatin, and fruit ice. Small amounts of dark chocolate. Limit all sweets and desserts. Seasonings and condiments All seasonings and condiments. The items listed above may not be a complete list of foods and drinks you can eat. Contact a dietitian for more options. What foods should I avoid? Fruits Canned fruit in heavy syrup. Fruit in cream or butter sauce. Fried fruit. Limit coconut. Vegetables Vegetables cooked in cheese, cream, or butter sauce. Fried vegetables. Grains Breads that are made with saturated or trans fats, oils, or whole milk. Croissants. Sweet rolls. Donuts. High-fat crackers, such as cheese crackers. Meats and other proteins Fatty meats, such as hot dogs, ribs, sausage, bacon, rib-eye roast or steak. High-fat deli meats, such as salami and bologna. Caviar. Domestic duck and goose. Organ meats, such as liver. Dairy Cream, sour cream, cream cheese, and creamed cottage cheese. Whole-milk cheeses. Whole or 2% milk that is liquid, evaporated, or condensed. Whole buttermilk. Cream sauce or high-fat cheese sauce. Yogurt that is made from whole milk. Fats and oils Meat fat, or shortening. Cocoa butter, hydrogenated oils, palm oil, coconut oil, palm kernel oil. Solid fats and shortenings, including bacon fat, salt pork, lard, and butter. Nondairy cream substitutes. Salad dressings with cheese or sour cream. Beverages Regular sodas and juice drinks with added sugar. Sweets and desserts Frosting. Pudding. Cookies. Cakes. Pies. Milk chocolate or white chocolate. Buttered syrups. Full-fat ice cream or ice cream drinks. The items listed above may not be a complete list of foods and drinks to avoid. Contact a dietitian for more information. Summary  Heart-healthy meal planning includes eating less unhealthy fats, eating more healthy fats, and making other changes in your diet.  Eat a balanced diet. This includes fruits and  vegetables, low-fat or nonfat dairy, lean protein, nuts and legumes, whole grains, and heart-healthy oils and fats. This information is not intended to replace advice given to you by your health care provider. Make sure you discuss any questions you have with your health care provider. Document Released: 01/14/2012 Document Revised: 08/22/2017 Document Reviewed: 08/22/2017 Elsevier Interactive Patient Education  2019 [place] DASH stands for "Dietary Approaches to Stop Hypertension." The DASH eating plan is a healthy eating plan that has been shown to reduce high blood pressure (hypertension). It may also reduce your risk for type 2 diabetes, heart disease, and stroke. The DASH eating plan may also help with weight loss. What are tips for following this plan?  General guidelines  Avoid eating more than 2,300 mg (milligrams) of salt (sodium) a day. If you have hypertension, you may need to reduce your sodium intake to 1,500 mg a day.  Limit alcohol intake to no more than 1 drink a day for nonpregnant women and 2 drinks a day for men. One drink equals 12 oz of beer, 5 oz of wine, or 1 oz of hard liquor.  Work with your health care provider to maintain a healthy body weight or to lose weight. Ask what an ideal weight is for you.  Get at least 30 minutes of exercise that causes  your heart to beat faster (aerobic exercise) most days of the week. Activities may include walking, swimming, or biking.  Work with your health care provider or diet and nutrition specialist (dietitian) to adjust your eating plan to your individual calorie needs. Reading food labels   Check food labels for the amount of sodium per serving. Choose foods with less than 5 percent of the Daily Value of sodium. Generally, foods with less than 300 mg of sodium per serving fit into this eating plan.  To find whole grains, look for the word "whole" as the first word in the ingredient list. Shopping  Buy  products labeled as "low-sodium" or "no salt added."  Buy fresh foods. Avoid canned foods and premade or frozen meals. Cooking  Avoid adding salt when cooking. Use salt-free seasonings or herbs instead of table salt or sea salt. Check with your health care provider or pharmacist before using salt substitutes.  Do not fry foods. Cook foods using healthy methods such as baking, boiling, grilling, and broiling instead.  Cook with heart-healthy oils, such as olive, canola, soybean, or sunflower oil. Meal planning  Eat a balanced diet that includes: ? 5 or more servings of fruits and vegetables each day. At each meal, try to fill half of your plate with fruits and vegetables. ? Up to 6-8 servings of whole grains each day. ? Less than 6 oz of lean meat, poultry, or fish each day. A 3-oz serving of meat is about the same size as a deck of cards. One egg equals 1 oz. ? 2 servings of low-fat dairy each day. ? A serving of nuts, seeds, or beans 5 times each week. ? Heart-healthy fats. Healthy fats called Omega-3 fatty acids are found in foods such as flaxseeds and coldwater fish, like sardines, salmon, and mackerel.  Limit how much you eat of the following: ? Canned or prepackaged foods. ? Food that is high in trans fat, such as fried foods. ? Food that is high in saturated fat, such as fatty meat. ? Sweets, desserts, sugary drinks, and other foods with added sugar. ? Full-fat dairy products.  Do not salt foods before eating.  Try to eat at least 2 vegetarian meals each week.  Eat more home-cooked food and less restaurant, buffet, and fast food.  When eating at a restaurant, ask that your food be prepared with less salt or no salt, if possible. What foods are recommended? The items listed may not be a complete list. Talk with your dietitian about what dietary choices are best for you. Grains Whole-grain or whole-wheat bread. Whole-grain or whole-wheat pasta. Brown rice. [place].  Bulgur. Whole-grain and low-sodium cereals. Pita bread. Low-fat, low-sodium crackers. Whole-wheat flour tortillas. Vegetables Fresh or frozen vegetables (raw, steamed, roasted, or grilled). Low-sodium or reduced-sodium tomato and vegetable juice. Low-sodium or reduced-sodium tomato sauce and tomato paste. Low-sodium or reduced-sodium canned vegetables. Fruits All fresh, dried, or frozen fruit. Canned fruit in natural juice (without added sugar). Meat and other protein foods Skinless chicken or [place]. Ground chicken or [place]. Pork with fat trimmed off. Fish and seafood. Egg whites. Dried beans, peas, or lentils. Unsalted nuts, nut butters, and seeds. Unsalted canned beans. Lean cuts of beef with fat trimmed off. Low-sodium, lean deli meat. Dairy Low-fat (1%) or fat-free (skim) milk. Fat-free, low-fat, or reduced-fat cheeses. Nonfat, low-sodium ricotta or cottage cheese. Low-fat or nonfat yogurt. Low-fat, low-sodium cheese. Fats and oils Soft margarine without trans fats. Vegetable oil. Low-fat, reduced-fat, or light mayonnaise  and salad dressings (reduced-sodium). Canola, safflower, olive, soybean, and sunflower oils. Avocado. Seasoning and other foods Herbs. Spices. Seasoning mixes without salt. Unsalted popcorn and pretzels. Fat-free sweets. What foods are not recommended? The items listed may not be a complete list. Talk with your dietitian about what dietary choices are best for you. Grains Baked goods made with fat, such as croissants, muffins, or some breads. Dry pasta or rice meal packs. Vegetables Creamed or fried vegetables. Vegetables in a cheese sauce. Regular canned vegetables (not low-sodium or reduced-sodium). Regular canned tomato sauce and paste (not low-sodium or reduced-sodium). Regular tomato and vegetable juice (not low-sodium or reduced-sodium). Rosita FirePickles. Olives. Fruits Canned fruit in a light or heavy syrup. Fried fruit. Fruit in cream or butter sauce. Meat and other  protein foods Fatty cuts of meat. Ribs. Fried meat. Tomasa BlaseBacon. Sausage. Bologna and other processed lunch meats. Salami. Fatback. Hotdogs. Bratwurst. Salted nuts and seeds. Canned beans with added salt. Canned or smoked fish. Whole eggs or egg yolks. Chicken or [place] with skin. Dairy Whole or 2% milk, cream, and half-and-half. Whole or full-fat cream cheese. Whole-fat or sweetened yogurt. Full-fat cheese. Nondairy creamers. Whipped toppings. Processed cheese and cheese spreads. Fats and oils Butter. Stick margarine. Lard. Shortening. Ghee. Bacon fat. Tropical oils, such as coconut, palm kernel, or palm oil. Seasoning and other foods Salted popcorn and pretzels. Onion salt, garlic salt, seasoned salt, table salt, and sea salt. Worcestershire sauce. Tartar sauce. Barbecue sauce. Teriyaki sauce. Soy sauce, including reduced-sodium. Steak sauce. Canned and packaged gravies. Fish sauce. Oyster sauce. Cocktail sauce. Horseradish that you find on the shelf. Ketchup. Mustard. Meat flavorings and tenderizers. Bouillon cubes. Hot sauce and Tabasco sauce. Premade or packaged marinades. Premade or packaged taco seasonings. Relishes. Regular salad dressings. Where to find more information:  National Heart, Lung, and Blood Institute: PopSteam.iswww.nhlbi.nih.gov  American Heart Association: www.heart.org Summary  The DASH eating plan is a healthy eating plan that has been shown to reduce high blood pressure (hypertension). It may also reduce your risk for type 2 diabetes, heart disease, and stroke.  With the DASH eating plan, you should limit salt (sodium) intake to 2,300 mg a day. If you have hypertension, you may need to reduce your sodium intake to 1,500 mg a day.  When on the DASH eating plan, aim to eat more fresh fruits and vegetables, whole grains, lean proteins, low-fat dairy, and heart-healthy fats.  Work with your health care provider or diet and nutrition specialist (dietitian) to adjust your eating plan to your  individual calorie needs. This information is not intended to replace advice given to you by your health care provider. Make sure you discuss any questions you have with your health care provider. Document Released: 07/04/2011 Document Revised: 07/08/2016 Document Reviewed: 07/08/2016 Elsevier Interactive Patient Education  2019 [employer] Exercising to [employer] MinorLose Weight Exercise is structured, repetitive physical activity to improve fitness and health. Getting regular exercise is important for everyone. It is especially important if you are overweight. Being overweight increases your risk of heart disease, stroke, diabetes, high blood pressure, and several types of cancer. Reducing your calorie intake and exercising can help you lose weight. Exercise is usually categorized as moderate or vigorous intensity. To lose weight, most people need to do a certain amount of moderate-intensity or vigorous-intensity exercise each week. Moderate-intensity exercise  Moderate-intensity exercise is any activity that gets you moving enough to burn at least three times more energy (calories) than if you were sitting. Examples of moderate exercise include:  Walking a mile in 15 minutes.  Doing light yard work.  Biking at an easy pace. Most people should get at least 150 minutes (2 hours and 30 minutes) a week of moderate-intensity exercise to maintain their body weight. Vigorous-intensity exercise Vigorous-intensity exercise is any activity that gets you moving enough to burn at least six times more calories than if you were sitting. When you exercise at this intensity, you should be working hard enough that you are not able to carry on a conversation. Examples of vigorous exercise include:  Running.  Playing a team sport, such as football, basketball, and soccer.  Jumping rope. Most people should get at least 75 minutes (1 hour and 15 minutes) a week of vigorous-intensity exercise to maintain their body  weight. How can exercise affect me? When you exercise enough to burn more calories than you eat, you lose weight. Exercise also reduces body fat and builds muscle. The more muscle you have, the more calories you burn. Exercise also:  Improves mood.  Reduces stress and tension.  Improves your overall fitness, flexibility, and endurance.  Increases bone strength. The amount of exercise you need to lose weight depends on:  Your age.  The type of exercise.  Any health conditions you have.  Your overall physical ability. Talk to your health care provider about how much exercise you need and what types of activities are safe for you. What actions can I take to lose weight? Nutrition   Make changes to your diet as told by your health care provider or diet and nutrition specialist (dietitian). This may include: ? Eating fewer calories. ? Eating more protein. ? Eating less unhealthy fats. ? Eating a diet that includes fresh fruits and vegetables, whole grains, low-fat dairy products, and lean protein. ? Avoiding foods with added fat, salt, and sugar.  Drink plenty of water while you exercise to prevent dehydration or heat stroke. Activity  Choose an activity that you enjoy and set realistic goals. Your health care provider can help you make an exercise plan that works for you.  Exercise at a moderate or vigorous intensity most days of the week. ? The intensity of exercise may vary from person to person. You can tell how intense a workout is for you by paying attention to your breathing and heartbeat. Most people will notice their breathing and heartbeat get faster with more intense exercise.  Do resistance training twice each week, such as: ? Push-ups. ? Sit-ups. ? Lifting weights. ? Using resistance bands.  Getting short amounts of exercise can be just as helpful as long structured periods of exercise. If you have trouble finding time to exercise, try to include exercise in your  daily routine. ? Get up, stretch, and walk around every 30 minutes throughout the day. ? Go for a walk during your lunch break. ? Park your car farther away from your destination. ? If you take public transportation, get off one stop early and walk the rest of the way. ? Make phone calls while standing up and walking around. ? Take the stairs instead of elevators or escalators.  Wear comfortable clothes and shoes with good support.  Do not exercise so much that you hurt yourself, feel dizzy, or get very short of breath. Where to find more information  U.S. Department of Health and Human Services: ThisPath.fiwww.hhs.gov  Centers for Disease Control and Prevention (CDC): FootballExhibition.com.brwww.cdc.gov Contact a health care provider:  Before starting a new exercise program.  If you have questions  or concerns about your weight.  If you have a medical problem that keeps you from exercising. Get help right away if you have any of the following while exercising:  Injury.  Dizziness.  Difficulty breathing or shortness of breath that does not go away when you stop exercising.  Chest pain.  Rapid heartbeat. Summary  Being overweight increases your risk of heart disease, stroke, diabetes, high blood pressure, and several types of cancer.  Losing weight happens when you burn more calories than you eat.  Reducing the amount of calories you eat in addition to getting regular moderate or vigorous exercise each week helps you lose weight. This information is not intended to replace advice given to you by your health care provider. Make sure you discuss any questions you have with your health care provider. Document Released: 08/17/2010 Document Revised: 07/28/2017 Document Reviewed: 07/28/2017 Elsevier Interactive Patient Education  2019 [employer].

## 2018-08-28 LAB — URINE CULTURE: Organism ID, Bacteria: NO GROWTH

## 2018-09-09 ENCOUNTER — Encounter: Payer: Self-pay | Admitting: Family Medicine

## 2018-09-09 ENCOUNTER — Ambulatory Visit (INDEPENDENT_AMBULATORY_CARE_PROVIDER_SITE_OTHER): Payer: Managed Care, Other (non HMO) | Admitting: Family Medicine

## 2018-09-09 VITALS — BP 138/92 | HR 80 | Temp 98.2°F | Ht 71.0 in | Wt 270.0 lb

## 2018-09-09 DIAGNOSIS — M79645 Pain in left finger(s): Secondary | ICD-10-CM | POA: Diagnosis not present

## 2018-09-09 DIAGNOSIS — R8290 Unspecified abnormal findings in urine: Secondary | ICD-10-CM

## 2018-09-09 DIAGNOSIS — Z09 Encounter for follow-up examination after completed treatment for conditions other than malignant neoplasm: Secondary | ICD-10-CM

## 2018-09-09 DIAGNOSIS — L089 Local infection of the skin and subcutaneous tissue, unspecified: Secondary | ICD-10-CM

## 2018-09-09 MED ORDER — TRIPLE ANTIBIOTIC 5-400-5000 EX OINT: TOPICAL_OINTMENT | Freq: Four times a day (QID) | CUTANEOUS | 1 refills | Status: DC

## 2018-09-09 MED ORDER — DOXYCYCLINE HYCLATE 100 MG PO TABS: 100.0000 mg | ORAL_TABLET | Freq: Two times a day (BID) | ORAL | 0 refills | Status: AC

## 2018-09-09 NOTE — Progress Notes (Signed)
[facility]  Sick Visit  Subjective:  Patient ID: Jacob Freeman, male    DOB: [birth year]  Age: 48 y.o. MRN: [id number]  CC:  Chief Complaint  Patient presents with  . Hand Pain    Swollen middle finger    HPI Jacob Freeman is as 48 year old male who presents for a Sick Visit today.   Past Medical History:  Diagnosis Date  . Chest pain, unspecified   . Esophageal reflux   . Hematuria   . Hypercholesteremia   . Hypertension   . Nephrolithiasis   . Pain in joint, hand      Current Status: Since his last office visit, he has c/o left middle finger infection X 1 week. He believes that he injured his finger while clipping his nails. He has been using Peroxide and cold press packs on finger with no relief. Finger pain and swelling is worsening. He denies visual changes, chest pain, cough, shortness of breath, heart palpitations, and falls. He has occasional headaches and dizziness with position changes. Denies severe headaches, confusion, seizures, double vision, and blurred vision, nausea and vomiting.  He denies fevers, chills, fatigue, recent infections, weight loss, and night sweats.  No reports of GI problems such as diarrhea, and constipation. He has no reports of blood in stools, dysuria and hematuria. No depression or anxiety reported. He denies pain today.   Past Surgical History:  Procedure Laterality Date  . TONSILLECTOMY      Family History  Problem Relation Age of Onset  . Brain cancer Father   . Hypertension Mother     Social History   Socioeconomic History  . Marital status: Married    Spouse name: Not on file  . Number of children: Not on file  . Years of education: Not on file  . Highest education level: Not on file  Occupational History  . Not on file  Social Needs  . Financial resource strain: Not on file  . Food insecurity:    Worry: Not on file    Inability: Not on file  . Transportation needs:   Medical: Not on file    Non-medical: Not on file  Tobacco Use  . Smoking status: Never Smoker  . Smokeless tobacco: Never Used  Substance and Sexual Activity  . Alcohol use: No  . Drug use: No  . Sexual activity: Not on file  Lifestyle  . Physical activity:    Days per week: Not on file    Minutes per session: Not on file  . Stress: Not on file  Relationships  . Social connections:    Talks on phone: Not on file    Gets together: Not on file    Attends religious service: Not on file    Active member of club or organization: Not on file    Attends meetings of clubs or organizations: Not on file    Relationship status: Not on file  . Intimate partner violence:    Fear of current or ex partner: Not on file    Emotionally abused: Not on file    Physically abused: Not on file    Forced sexual activity: Not on file  Other Topics Concern  . Not on file  Social History Narrative  . Not on file    Outpatient Medications Prior to Visit  Medication Sig Dispense Refill  . amLODipine (NORVASC) 10 MG tablet Take 1 tablet (10 mg total) by mouth daily. 90  tablet 3  . amitriptyline (ELAVIL) 50 MG tablet Take 50 mg by mouth at bedtime.     . cyclobenzaprine (FLEXERIL) 10 MG tablet Take 1 tablet (10 mg total) by mouth 3 (three) times daily as needed for muscle spasms. (Patient not taking: Reported on 08/26/2018) 15 tablet 0  . gabapentin (NEURONTIN) 100 MG capsule Take 100 mg by mouth 2 (two) times daily.   2  . ibuprofen (ADVIL,MOTRIN) 200 MG tablet Take 800 mg by mouth every 6 (six) hours as needed for mild pain.     . meclizine (ANTIVERT) 25 MG tablet Take 25 mg by mouth 3 (three) times daily.     . ondansetron (ZOFRAN ODT) 4 MG disintegrating tablet Take 1 tablet (4 mg total) by mouth every 8 (eight) hours as needed for nausea. (Patient not taking: Reported on 08/26/2018) 6 tablet 0   No facility-administered medications prior to visit.     No Known Allergies  ROS Review of Systems    Constitutional: Negative.   HENT: Negative.   Eyes: Negative.   Respiratory: Negative.   Cardiovascular: Negative.   Gastrointestinal: Negative.   Endocrine: Negative.   Genitourinary: Negative.   Musculoskeletal: Negative.   Skin: Negative.   Allergic/Immunologic: Negative.   Neurological: Positive for dizziness and headaches.  Hematological: Negative.   Psychiatric/Behavioral: Negative.    Objective:    Physical Exam  Constitutional: He is oriented to person, place, and time. He appears well-developed and well-nourished.  HENT:  Head: Normocephalic and atraumatic.  Eyes: Conjunctivae are normal.  Neck: Normal range of motion. Neck supple.  Cardiovascular: Normal rate, regular rhythm, normal heart sounds and intact distal pulses.  Pulmonary/Chest: Effort normal and breath sounds normal.  Abdominal: Soft. Bowel sounds are normal.  Musculoskeletal: Normal range of motion.       Arms:     Comments: Left middle finger erythema, swelling and pain.  Neurological: He is alert and oriented to person, place, and time.  Skin: Skin is warm and dry.  Psychiatric: He has a normal mood and affect. His behavior is normal. Judgment and thought content normal.  Vitals reviewed.  BP (!) 138/92 (BP Location: Left Arm, Patient Position: Sitting, Cuff Size: Large)   Pulse 80   Temp 98.2 F (36.8 C) (Oral)   Ht 5\' 11"  (1.803 m)   Wt 270 lb (122.5 kg)   SpO2 96%   BMI 37.66 kg/m  Wt Readings from Last 3 Encounters:  09/09/18 270 lb (122.5 kg)  08/26/18 273 lb (123.8 kg)  02/23/18 266 lb (120.7 kg)   Health Maintenance Due  Topic Date Due  . HIV Screening  10/27/1985    There are no preventive care reminders to display for this patient.  Lab Results  Component Value Date   TSH 2.110 09/05/2017   Lab Results  Component Value Date   WBC 11.4 (H) 12/16/2017   HGB 14.8 12/16/2017   HCT 45.7 12/16/2017   MCV 81.8 12/16/2017   PLT 227 12/16/2017   Lab Results  Component  Value Date   NA 140 12/16/2017   K 3.7 12/16/2017   CO2 26 12/16/2017   GLUCOSE 114 (H) 12/16/2017   BUN 11 12/16/2017   CREATININE 1.46 (H) 12/16/2017   BILITOT 1.4 (H) 12/16/2017   ALKPHOS 75 12/16/2017   AST 20 12/16/2017   ALT 33 12/16/2017   PROT 7.9 12/16/2017   ALBUMIN 4.1 12/16/2017   CALCIUM 9.1 12/16/2017   ANIONGAP 11 12/16/2017   Lab Results  Component Value Date   CHOL 182 09/05/2017   Lab Results  Component Value Date   HDL 55 09/05/2017   Lab Results  Component Value Date   LDLCALC 114 (H) 09/05/2017   Lab Results  Component Value Date   TRIG 66 09/05/2017   Lab Results  Component Value Date   CHOLHDL 3.3 09/05/2017   Lab Results  Component Value Date   HGBA1C 5.6 08/26/2018   Assessment & Plan:   1. Finger pain, left  2. Skin infection We will  Initiate Doxycycline today.  - doxycycline (VIBRA-TABS) 100 MG tablet; Take 1 tablet (100 mg total) by mouth 2 (two) times daily for 7 days.  Dispense: 14 tablet; Refill: 0 - neomycin-bacitracin-polymyxin (NEOSPORIN) [id number] ointment; Apply topically 4 (four) times daily.  Dispense: 28.3 g; Refill: 1  3. Abnormal urinalysis Results are pending. - Urine Culture  4. Follow up He will keep follow up appointment 01/2019.  Meds ordered this encounter  Medications  . doxycycline (VIBRA-TABS) 100 MG tablet    Sig: Take 1 tablet (100 mg total) by mouth 2 (two) times daily for 7 days.    Dispense:  14 tablet    Refill:  0  . neomycin-bacitracin-polymyxin (NEOSPORIN) [id number] ointment    Sig: Apply topically 4 (four) times daily.    Dispense:  28.3 g    Refill:  1   Orders Placed This Encounter  Procedures  . Urine Culture   Referral Orders  No referral(s) requested today   Raliegh Ip,  MSN, FNP-C [facility] [address] [place], [place] [address] [phone]   Problem List Items Addressed This Visit    None    Visit Diagnoses    Finger  pain, left    -  Primary   Skin infection       Relevant Medications   doxycycline (VIBRA-TABS) 100 MG tablet   neomycin-bacitracin-polymyxin (NEOSPORIN) [id number] ointment   Abnormal urinalysis       Relevant Orders   Urine Culture (Completed)   Follow up          Meds ordered this encounter  Medications  . doxycycline (VIBRA-TABS) 100 MG tablet    Sig: Take 1 tablet (100 mg total) by mouth 2 (two) times daily for 7 days.    Dispense:  14 tablet    Refill:  0  . neomycin-bacitracin-polymyxin (NEOSPORIN) [id number] ointment    Sig: Apply topically 4 (four) times daily.    Dispense:  28.3 g    Refill:  1    Follow-up: No follow-ups on file.    Kallie Locks, FNP

## 2018-09-09 NOTE — Patient Instructions (Signed)
Bacitracin; Neomycin; Polymyxin B skin ointment What is this medicine? BACITRACIN; NEOMYCIN; POLYMYXIN (bass i TRAY sin; nee oh MYE sin; pol i MIX in) is used to treat skin infections. This medicine may be used for other purposes; ask your health care provider or pharmacist if you have questions. COMMON BRAND NAME(S): Neosporin, Triple Antibiotic What should I tell my health care provider before I take this medicine? They need to know if you have any of these conditions: -animal bite -deep wound -serious burn -an unusual or allergic reaction to this bacitracin, neomycin, polymyxin, other medicines, foods, dyes, or preservatives -pregnant or trying to get pregnant -breast-feeding How should I use this medicine? This medicine is for external use only. Do not take by mouth. Follow the directions on the prescription label. Wash hands before and after use. Apply a thin film of medicine to the affected area. Use your doses at regular intervals. Do not use your medicine more often than directed. Talk to your pediatrician regarding the use of this medicine in children. Special care may be needed. Overdosage: If you think you have taken too much of this medicine contact a poison control center or emergency room at once. NOTE: This medicine is only for you. Do not share this medicine with others. What if I miss a dose? If you miss a dose, use it as soon as you can. If it is almost time for your next dose, use only that dose. Do not use double or extra doses. What may interact with this medicine? Interactions are not expected. Do not use any other skin products on the affected area without asking your doctor or health care professional. This list may not describe all possible interactions. Give your health care provider a list of all the medicines, herbs, non-prescription drugs, or dietary supplements you use. Also tell them if you smoke, drink alcohol, or use illegal drugs. Some items may interact with  your medicine. What should I watch for while using this medicine? Tell your doctor or health care professional if your symptoms do not get better or if they get worse. Do not use longer than 7 days unless instructed by your doctor. What side effects may I notice from receiving this medicine? Side effects that you should report to your doctor or health care professional as soon as possible: -allergic reactions like skin rash, itching or hives, swelling of the face, lips, or tongue -infection, redness, swelling of skin This list may not describe all possible side effects. Call your doctor for medical advice about side effects. You may report side effects to FDA at 1-800-FDA-1088. Where should I keep my medicine? Keep out of the reach of children. Store at room temperature between 20 and 25 degrees C (68 and 77 degrees F). Throw away any unused medicine after the expiration date. NOTE: This sheet is a summary. It may not cover all possible information. If you have questions about this medicine, talk to your doctor, pharmacist, or health care provider.  2019 Elsevier/Gold Standard (2015-08-17 11:00:08) Doxycycline tablets or capsules What is this medicine? DOXYCYCLINE (dox i SYE kleen) is a tetracycline antibiotic. It kills certain bacteria or stops their growth. It is used to treat many kinds of infections, like dental, skin, respiratory, and urinary tract infections. It also treats acne, Lyme disease, malaria, and certain sexually transmitted infections. This medicine may be used for other purposes; ask your health care provider or pharmacist if you have questions. COMMON BRAND NAME(S): Acticlate, Adoxa, Adoxa CK, Adoxa Pak,  Adoxa TT, Alodox, Avidoxy, Doxal, LYMEPAK, Mondoxyne NL, Monodox, Morgidox 1x, Morgidox 1x Kit, Morgidox 2x, Morgidox 2x Kit, NutriDox, Ocudox, TARGADOX, Vibra-Tabs, Vibramycin What should I tell my health care provider before I take this medicine? They need to know if you have  any of these conditions: -liver disease -long exposure to sunlight like working outdoors -stomach problems like colitis -an unusual or allergic reaction to doxycycline, tetracycline antibiotics, other medicines, foods, dyes, or preservatives -pregnant or trying to get pregnant -breast-feeding How should I use this medicine? Take this medicine by mouth with a full glass of water. Follow the directions on the prescription label. It is best to take this medicine without food, but if it upsets your stomach take it with food. Take your medicine at regular intervals. Do not take your medicine more often than directed. Take all of your medicine as directed even if you think you are better. Do not skip doses or stop your medicine early. Talk to your pediatrician regarding the use of this medicine in children. While this drug may be prescribed for selected conditions, precautions do apply. Overdosage: If you think you have taken too much of this medicine contact a poison control center or emergency room at once. NOTE: This medicine is only for you. Do not share this medicine with others. What if I miss a dose? If you miss a dose, take it as soon as you can. If it is almost time for your next dose, take only that dose. Do not take double or extra doses. What may interact with this medicine? -antacids -barbiturates -birth control pills -bismuth subsalicylate -carbamazepine -methoxyflurane -other antibiotics -phenytoin -vitamins that contain iron -warfarin This list may not describe all possible interactions. Give your health care provider a list of all the medicines, herbs, non-prescription drugs, or dietary supplements you use. Also tell them if you smoke, drink alcohol, or use illegal drugs. Some items may interact with your medicine. What should I watch for while using this medicine? Tell your doctor or health care professional if your symptoms do not improve. Do not treat diarrhea with over the  counter products. Contact your doctor if you have diarrhea that lasts more than 2 days or if it is severe and watery. Do not take this medicine just before going to bed. It may not dissolve properly when you lay down and can cause pain in your throat. Drink plenty of fluids while taking this medicine to also help reduce irritation in your throat. This medicine can make you more sensitive to the sun. Keep out of the sun. If you cannot avoid being in the sun, wear protective clothing and use sunscreen. Do not use sun lamps or tanning beds/booths. Birth control pills may not work properly while you are taking this medicine. Talk to your doctor about using an extra method of birth control. If you are being treated for a sexually transmitted infection, avoid sexual contact until you have finished your treatment. Your sexual partner may also need treatment. Avoid antacids, aluminum, calcium, magnesium, and iron products for 4 hours before and 2 hours after taking a dose of this medicine. If you are using this medicine to prevent malaria, you should still protect yourself from contact with mosquitos. Stay in screened-in areas, use mosquito nets, keep your body covered, and use an insect repellent. What side effects may I notice from receiving this medicine? Side effects that you should report to your doctor or health care professional as soon as possible: -allergic reactions like  skin rash, itching or hives, swelling of the face, lips, or tongue -difficulty breathing -fever -itching in the rectal or genital area -pain on swallowing -redness, blistering, peeling or loosening of the skin, including inside the mouth -severe stomach pain or cramps -unusual bleeding or bruising -unusually weak or tired -yellowing of the eyes or skin Side effects that usually do not require medical attention (report to your doctor or health care professional if they continue or are bothersome): -diarrhea -loss of  appetite -nausea, vomiting This list may not describe all possible side effects. Call your doctor for medical advice about side effects. You may report side effects to FDA at 1-800-FDA-1088. Where should I keep my medicine? Keep out of the reach of children. Store at room temperature, below 30 degrees C (86 degrees F). Protect from light. Keep container tightly closed. Throw away any unused medicine after the expiration date. Taking this medicine after the expiration date can make you seriously ill. NOTE: This sheet is a summary. It may not cover all possible information. If you have questions about this medicine, talk to your doctor, pharmacist, or health care provider.  2019 Elsevier/Gold Standard (2015-08-16 17:11:22)

## 2018-09-10 LAB — URINE CULTURE

## 2018-09-11 DIAGNOSIS — M79645 Pain in left finger(s): Secondary | ICD-10-CM | POA: Insufficient documentation

## 2018-09-11 DIAGNOSIS — L089 Local infection of the skin and subcutaneous tissue, unspecified: Secondary | ICD-10-CM | POA: Insufficient documentation

## 2018-09-12 ENCOUNTER — Other Ambulatory Visit: Payer: Self-pay | Admitting: Family Medicine

## 2018-09-21 ENCOUNTER — Encounter (HOSPITAL_COMMUNITY): Payer: Self-pay

## 2018-09-21 ENCOUNTER — Emergency Department (HOSPITAL_COMMUNITY)
Admission: EM | Admit: 2018-09-21 | Discharge: 2018-09-21 | Disposition: A | Discharge status: Home / Self Care | Payer: No Typology Code available for payment source | Attending: Emergency Medicine | Admitting: Emergency Medicine

## 2018-09-21 ENCOUNTER — Emergency Department (HOSPITAL_COMMUNITY): Payer: No Typology Code available for payment source

## 2018-09-21 DIAGNOSIS — Z79899 Other long term (current) drug therapy: Secondary | ICD-10-CM | POA: Diagnosis not present

## 2018-09-21 DIAGNOSIS — R109 Unspecified abdominal pain: Secondary | ICD-10-CM | POA: Insufficient documentation

## 2018-09-21 DIAGNOSIS — I10 Essential (primary) hypertension: Secondary | ICD-10-CM | POA: Insufficient documentation

## 2018-09-21 LAB — URINALYSIS, ROUTINE W REFLEX MICROSCOPIC
Bilirubin Urine: NEGATIVE
Glucose, UA: NEGATIVE mg/dL
KETONES UR: NEGATIVE mg/dL
Leukocytes,Ua: NEGATIVE
NITRITE: NEGATIVE
PH: 6.5 (ref 5.0–8.0)
PROTEIN: NEGATIVE mg/dL
SPECIFIC GRAVITY, URINE: 1.01 (ref 1.005–1.030)

## 2018-09-21 LAB — URINALYSIS, MICROSCOPIC (REFLEX)
BACTERIA UA: NONE SEEN
WBC, UA: NONE SEEN WBC/hpf (ref 0–5)

## 2018-09-21 MED ORDER — OXYCODONE-ACETAMINOPHEN 5-325 MG PO TABS
2.0000 | ORAL_TABLET | Freq: Once | ORAL | Status: AC
  Administered 2018-09-21: 2 via ORAL
  Filled 2018-09-21: qty 2

## 2018-09-21 MED ORDER — HYDROMORPHONE HCL 1 MG/ML IJ SOLN
2.0000 mg | Freq: Once | INTRAMUSCULAR | Status: AC
  Administered 2018-09-21: 2 mg via INTRAMUSCULAR
  Filled 2018-09-21: qty 2

## 2018-09-21 MED ORDER — KETOROLAC TROMETHAMINE 60 MG/2ML IM SOLN
60.0000 mg | Freq: Once | INTRAMUSCULAR | Status: AC
  Administered 2018-09-21: 60 mg via INTRAMUSCULAR
  Filled 2018-09-21: qty 2

## 2018-09-21 MED ORDER — ONDANSETRON 4 MG PO TBDP
4.0000 mg | ORAL_TABLET | Freq: Once | ORAL | Status: AC
  Administered 2018-09-21: 4 mg via ORAL
  Filled 2018-09-21: qty 1

## 2018-09-21 MED ORDER — OXYCODONE-ACETAMINOPHEN 5-325 MG PO TABS: 1.0000 | ORAL_TABLET | ORAL | 0 refills | Status: DC | PRN

## 2018-09-21 NOTE — ED Triage Notes (Signed)
Pt presents for evaluation of L hip pain. States it started today, worse with movement. Hx of kidney stones but states this feels different. Denies fall or trauma.

## 2018-09-21 NOTE — ED Provider Notes (Signed)
[facility] EMERGENCY DEPARTMENT Provider Note   CSN: [id number] Arrival date & time: 09/21/18  1311    History   Chief Complaint Chief Complaint  Patient presents with  . Hip Pain    HPI Jacob Freeman is a 48 y.o. male.     Patient is a 48 year old male presenting with complaints of left flank pain.  This started acutely after stepping down from the running board of the truck while cleaning off of the window.  He describes severe pain to the left lower quadrant radiating into his back.  He denies any fevers or chills.  He denies any urinary complaints.  He denies any weakness, numbness, or tingling.  His pain is worse when he ambulates and changes position.  The history is provided by the patient.  Hip Pain  This is a new problem. The current episode started 1 to 2 hours ago. The problem occurs constantly. The problem has been rapidly worsening. Nothing aggravates the symptoms. Nothing relieves the symptoms. He has tried nothing for the symptoms.    Past Medical History:  Diagnosis Date  . Chest pain, unspecified   . Esophageal reflux   . Hematuria   . Hypercholesteremia   . Hypertension   . Nephrolithiasis   . Pain in joint, hand     Patient Active Problem List   Diagnosis Date Noted  . Finger pain, left 09/11/2018  . Skin infection 09/11/2018  . Essential hypertension 08/26/2018  . Class 2 severe obesity with serious comorbidity and body mass index (BMI) of 38.0 to 38.9 in adult [facility]) 08/26/2018    Past Surgical History:  Procedure Laterality Date  . TONSILLECTOMY          Home Medications    Prior to Admission medications   Medication Sig Start Date End Date Taking? Authorizing Provider  amitriptyline (ELAVIL) 50 MG tablet Take 50 mg by mouth at bedtime.  10/12/17   [provider]  amLODipine (NORVASC) 10 MG tablet TAKE 1 TABLET BY MOUTH ONCE DAILY 09/15/18   Kallie Locks, FNP  cyclobenzaprine (FLEXERIL) 10 MG tablet Take 1  tablet (10 mg total) by mouth 3 (three) times daily as needed for muscle spasms. Patient not taking: Reported on 08/26/2018 11/24/17   Street, [place], PA-C  gabapentin (NEURONTIN) 100 MG capsule Take 100 mg by mouth 2 (two) times daily.  11/19/17   [provider]  ibuprofen (ADVIL,MOTRIN) 200 MG tablet Take 800 mg by mouth every 6 (six) hours as needed for mild pain.     [provider]  meclizine (ANTIVERT) 25 MG tablet Take 25 mg by mouth 3 (three) times daily.  10/12/17   [provider]  neomycin-bacitracin-polymyxin (NEOSPORIN) [phone] ointment Apply topically 4 (four) times daily. 09/09/18   Kallie Locks, FNP    Family History Family History  Problem Relation Age of Onset  . Brain cancer Father   . Hypertension Mother     Social History Social History   Tobacco Use  . Smoking status: Never Smoker  . Smokeless tobacco: Never Used  Substance Use Topics  . Alcohol use: No  . Drug use: No     Allergies   Patient has no known allergies.   Review of Systems Review of Systems  All other systems reviewed and are negative.    Physical Exam Updated Vital Signs BP 139/82 (BP Location: Left Arm)   Pulse 71   Temp (!) 97.4 F (36.3 C) (Oral)   Resp  16   SpO2 97%   Physical Exam Vitals signs and nursing note reviewed.  Constitutional:      General: He is not in acute distress.    Appearance: He is well-developed. He is not diaphoretic.  HENT:     Head: Normocephalic and atraumatic.  Neck:     Musculoskeletal: Normal range of motion and neck supple.  Cardiovascular:     Rate and Rhythm: Normal rate and regular rhythm.     Heart sounds: No murmur. No friction rub.  Pulmonary:     Effort: Pulmonary effort is normal. No respiratory distress.     Breath sounds: Normal breath sounds. No wheezing or rales.  Abdominal:     General: Bowel sounds are normal. There is no distension.     Palpations: Abdomen is soft.     Tenderness: There is  abdominal tenderness. There is no guarding or rebound.     Comments: There is tenderness to palpation in the left lower quadrant and left flank.  Musculoskeletal: Normal range of motion.     Comments: There is no lumbar spine bony tenderness or step-off.  There is some tenderness in the left lumbar soft tissues.  Skin:    General: Skin is warm and dry.  Neurological:     Mental Status: He is alert and oriented to person, place, and time.     Coordination: Coordination normal.      ED Treatments / Results  Labs (all labs ordered are listed, but only abnormal results are displayed) Labs Reviewed  URINALYSIS, ROUTINE W REFLEX MICROSCOPIC    EKG None  Radiology No results found.  Procedures Procedures (including critical care time)  Medications Ordered in ED Medications  ketorolac (TORADOL) injection 60 mg (has no administration in time range)  oxyCODONE-acetaminophen (PERCOCET/ROXICET) 5-325 MG per tablet 2 tablet (has no administration in time range)     Initial Impression / Assessment and Plan / ED Course  I have reviewed the triage vital signs and the nursing notes.  Pertinent labs & imaging results that were available during my care of the patient were reviewed by me and considered in my medical decision making (see chart for details).  Patient presents with complaints of severe pain in his left flank and left lateral hip after stepping down from the running board of a truck.  His abdominal exam is benign and he has reflexes and strength that are equal in both lower extremities.  Due to the degree of the patient's discomfort, a CT scanning of the abdomen and pelvis was obtained showing no evidence for renal calculus or other pathology.  Patient will be discharged with pain medication and follow-up as needed.  Final Clinical Impressions(s) / ED Diagnoses   Final diagnoses:  None    ED Discharge Orders    None       Geoffery Lyons, MD 09/21/18 1753

## 2018-09-21 NOTE — Discharge Instructions (Addendum)
Ibuprofen 600 mg 3 times daily for the next 5 days.  Percocet as prescribed as needed for pain not relieved with ibuprofen.  Follow-up with your primary doctor if your symptoms or not improving in the next week, and return to the ER if you develop worsening pain, high fever, or other new and concerning symptoms.

## 2018-09-22 ENCOUNTER — Telehealth: Payer: Self-pay | Admitting: *Deleted

## 2018-09-22 NOTE — Telephone Encounter (Signed)
Pharmacy called related to Rx:   oxyCODONE-acetaminophen (PERCOCET) 5-325 MG tablet   .Marland KitchenMarland KitchenEDCM clarified with EDP (Hedges) to change Rx sig to: take 1 (one) tablet every 4 hours as needed for pain.

## 2018-09-23 ENCOUNTER — Encounter: Payer: Self-pay | Admitting: Family Medicine

## 2018-09-23 ENCOUNTER — Telehealth: Payer: Self-pay

## 2018-09-23 ENCOUNTER — Ambulatory Visit (INDEPENDENT_AMBULATORY_CARE_PROVIDER_SITE_OTHER): Payer: Managed Care, Other (non HMO) | Admitting: Family Medicine

## 2018-09-23 VITALS — BP 138/82 | HR 80 | Temp 98.7°F | Ht 71.0 in | Wt 268.0 lb

## 2018-09-23 DIAGNOSIS — S39011D Strain of muscle, fascia and tendon of abdomen, subsequent encounter: Secondary | ICD-10-CM

## 2018-09-23 DIAGNOSIS — Z09 Encounter for follow-up examination after completed treatment for conditions other than malignant neoplasm: Secondary | ICD-10-CM

## 2018-09-23 DIAGNOSIS — S39011A Strain of muscle, fascia and tendon of abdomen, initial encounter: Secondary | ICD-10-CM | POA: Insufficient documentation

## 2018-09-23 DIAGNOSIS — R109 Unspecified abdominal pain: Secondary | ICD-10-CM

## 2018-09-23 DIAGNOSIS — T148XXA Other injury of unspecified body region, initial encounter: Secondary | ICD-10-CM | POA: Diagnosis not present

## 2018-09-23 DIAGNOSIS — R10A0 Flank pain, unspecified side: Secondary | ICD-10-CM | POA: Insufficient documentation

## 2018-09-23 MED ORDER — CYCLOBENZAPRINE HCL 10 MG PO TABS: 10.0000 mg | ORAL_TABLET | Freq: Two times a day (BID) | ORAL | 2 refills | Status: DC

## 2018-09-23 MED ORDER — KETOROLAC TROMETHAMINE 60 MG/2ML IM SOLN
60.0000 mg | Freq: Once | INTRAMUSCULAR | Status: AC
  Administered 2018-09-23: 60 mg via INTRAMUSCULAR

## 2018-09-23 NOTE — Telephone Encounter (Signed)
Patient advise that he needs to have job send forms to fill out for to take him out of work

## 2018-09-23 NOTE — Progress Notes (Signed)
[facility]  Hospital Follow Up  Subjective:  Patient ID: Jacob Freeman, male    DOB: [birth year]  Age: 48 y.o. MRN: [id number]  CC:  Chief Complaint  Patient presents with  . Hospitalization Follow-up    HPI Jacob Freeman is a 48 year old male who presents for Hospital Follow Up today.   Past Medical History:  Diagnosis Date  . Chest pain, unspecified   . Esophageal reflux   . Hematuria   . Hypercholesteremia   . Hypertension   . Nephrolithiasis   . Pain in joint, hand    Current Status: Since his last office visit, he reports has been to the ED for left flank pain on 09/21/2018. He was at work at this time. He was prescribed Percocet and discharged home. He has been using cane to aide in ambulating. He states that his pain is improving. He denies visual changes, chest pain, cough, shortness of breath, heart palpitations, and falls. He has occasional headaches and dizziness with position changes. Denies severe headaches, confusion, seizures, double vision, and blurred vision, nausea and vomiting.   He denies fevers, chills, fatigue, recent infections, weight loss, and night sweats. No reports of GI problems such as diarrhea, and constipation. She has no reports of blood in stools, dysuria and hematuria. No depression or anxiety reported.   Past Surgical History:  Procedure Laterality Date  . TONSILLECTOMY      Family History  Problem Relation Age of Onset  . Brain cancer Father   . Hypertension Mother     Social History   Socioeconomic History  . Marital status: Married    Spouse name: Not on file  . Number of children: Not on file  . Years of education: Not on file  . Highest education level: Not on file  Occupational History  . Not on file  Social Needs  . Financial resource strain: Not on file  . Food insecurity:    Worry: Not on file    Inability: Not on file  . Transportation needs:    Medical: Not on file    Non-medical: Not on file  Tobacco Use  . Smoking status: Never Smoker  . Smokeless tobacco: Never Used  Substance and Sexual Activity  . Alcohol use: No  . Drug use: No  . Sexual activity: Not on file  Lifestyle  . Physical activity:    Days per week: Not on file    Minutes per session: Not on file  . Stress: Not on file  Relationships  . Social connections:    Talks on phone: Not on file    Gets together: Not on file    Attends religious service: Not on file    Active member of club or organization: Not on file    Attends meetings of clubs or organizations: Not on file    Relationship status: Not on file  . Intimate partner violence:    Fear of current or ex partner: Not on file    Emotionally abused: Not on file    Physically abused: Not on file    Forced sexual activity: Not on file  Other Topics Concern  . Not on file  Social History Narrative  . Not on file    Outpatient Medications Prior to Visit  Medication Sig Dispense Refill  . acetaminophen (TYLENOL) 500 MG tablet Take 500 mg by mouth every 6 (six) hours as needed for mild pain.    Marland Kitchen  amLODipine (NORVASC) 10 MG tablet TAKE 1 TABLET BY MOUTH ONCE DAILY (Patient taking differently: Take 10 mg by mouth daily. ) 30 tablet 0  . ibuprofen (ADVIL,MOTRIN) 200 MG tablet Take 800 mg by mouth every 6 (six) hours as needed for mild pain.     Marland Kitchen neomycin-bacitracin-polymyxin (NEOSPORIN) [phone] ointment Apply topically 4 (four) times daily. 28.3 g 1  . oxyCODONE-acetaminophen (PERCOCET) 5-325 MG tablet Take 1-2 tablets by mouth every 4 (four) hours as needed. 20 tablet 0  . cyclobenzaprine (FLEXERIL) 10 MG tablet Take 1 tablet (10 mg total) by mouth 3 (three) times daily as needed for muscle spasms. (Patient not taking: Reported on 08/26/2018) 15 tablet 0   No facility-administered medications prior to visit.     No Known Allergies  ROS Review of Systems  Constitutional: Positive for chills.  HENT: Negative.   Eyes:  Negative.   Respiratory: Negative.   Cardiovascular: Negative.   Gastrointestinal: Positive for abdominal distention (Obese) and nausea (Minimal).  Endocrine: Negative.   Genitourinary: Positive for flank pain (left with radiation to gluteous).  Musculoskeletal: Positive for arthralgias (left flank pain).  Skin: Negative.   Allergic/Immunologic: Negative.   Neurological: Negative.   Hematological: Negative.   Psychiatric/Behavioral: Negative.    Objective:    Physical Exam  Constitutional: He is oriented to person, place, and time. He appears well-developed and well-nourished.  HENT:  Head: Normocephalic and atraumatic.  Eyes: Conjunctivae are normal.  Neck: Normal range of motion. Neck supple.  Cardiovascular: Normal rate, regular rhythm, normal heart sounds and intact distal pulses.  Pulmonary/Chest: Effort normal and breath sounds normal.  Abdominal: Soft. Bowel sounds are normal.  Musculoskeletal: Normal range of motion.  Neurological: He is alert and oriented to person, place, and time. He has normal reflexes.  Skin: Skin is warm and dry.  Psychiatric: He has a normal mood and affect. His behavior is normal. Judgment and thought content normal.  Nursing note and vitals reviewed.   BP 138/82 (BP Location: Left Arm, Patient Position: Sitting, Cuff Size: Large)   Pulse 80   Temp 98.7 F (37.1 C) (Oral)   Ht 5\' 11"  (1.803 m)   Wt 268 lb (121.6 kg)   SpO2 98%   BMI 37.38 kg/m  Wt Readings from Last 3 Encounters:  09/23/18 268 lb (121.6 kg)  09/09/18 270 lb (122.5 kg)  08/26/18 273 lb (123.8 kg)     Health Maintenance Due  Topic Date Due  . HIV Screening  10/27/1985    There are no preventive care reminders to display for this patient.  Lab Results  Component Value Date   TSH 2.110 09/05/2017   Lab Results  Component Value Date   WBC 11.4 (H) 12/16/2017   HGB 14.8 12/16/2017   HCT 45.7 12/16/2017   MCV 81.8 12/16/2017   PLT 227 12/16/2017   Lab Results   Component Value Date   NA 140 12/16/2017   K 3.7 12/16/2017   CO2 26 12/16/2017   GLUCOSE 114 (H) 12/16/2017   BUN 11 12/16/2017   CREATININE 1.46 (H) 12/16/2017   BILITOT 1.4 (H) 12/16/2017   ALKPHOS 75 12/16/2017   AST 20 12/16/2017   ALT 33 12/16/2017   PROT 7.9 12/16/2017   ALBUMIN 4.1 12/16/2017   CALCIUM 9.1 12/16/2017   ANIONGAP 11 12/16/2017   Lab Results  Component Value Date   CHOL 182 09/05/2017   Lab Results  Component Value Date   HDL 55 09/05/2017   Lab Results  Component Value Date   LDLCALC 114 (H) 09/05/2017   Lab Results  Component Value Date   TRIG 66 09/05/2017   Lab Results  Component Value Date   CHOLHDL 3.3 09/05/2017   Lab Results  Component Value Date   HGBA1C 5.6 08/26/2018   Assessment & Plan:   1. Strain of flank We will initiate Flexeril today. He will report to office if symptoms do not improve or worsen. Additional instructions printed on discharge AVS.  - cyclobenzaprine (FLEXERIL) 10 MG tablet; Take 1 tablet (10 mg total) by mouth 2 (two) times daily.  Dispense: 30 tablet; Refill: 2  2. Flank pain We will initiate Toradol today.  - ketorolac (TORADOL) injection 60 mg  3. Muscle strain - ketorolac (TORADOL) injection 60 mg  4. Follow up He will keep previously scheduled follow  Meds ordered this encounter  Medications  . cyclobenzaprine (FLEXERIL) 10 MG tablet    Sig: Take 1 tablet (10 mg total) by mouth 2 (two) times daily.    Dispense:  30 tablet    Refill:  2  . ketorolac (TORADOL) injection 60 mg   No orders of the defined types were placed in this encounter.   Referral Orders  No referral(s) requested today    Raliegh Ip,  MSN, FNP-C [facility] [address] [place], [place] [address] [phone]   Problem List Items Addressed This Visit    None    Visit Diagnoses    Muscle relaxation    -  Primary   Relevant Medications   cyclobenzaprine (FLEXERIL) 10  MG tablet   Flank pain       Relevant Medications   ketorolac (TORADOL) injection 60 mg (Completed)   Muscle strain       Relevant Medications   ketorolac (TORADOL) injection 60 mg (Completed)   Follow up          Meds ordered this encounter  Medications  . cyclobenzaprine (FLEXERIL) 10 MG tablet    Sig: Take 1 tablet (10 mg total) by mouth 2 (two) times daily.    Dispense:  30 tablet    Refill:  2  . ketorolac (TORADOL) injection 60 mg    Follow-up: Return in about 1 month (around 10/22/2018).    Kallie Locks, FNP

## 2018-09-23 NOTE — Patient Instructions (Signed)
Acetaminophen tablets or caplets What is this medicine? ACETAMINOPHEN (a set a MEE noe fen) is a pain reliever. It is used to treat mild pain and fever. This medicine may be used for other purposes; ask your health care provider or pharmacist if you have questions. COMMON BRAND NAME(S): Aceta, Actamin, Anacin Aspirin Free, Genapap, Genebs, Mapap, Pain & Fever, Pain and Fever, PAIN RELIEF, PAIN RELIEF Extra Strength, Pain Reliever, Panadol, PHARBETOL, Q-Pap, Q-Pap Extra Strength, Tylenol, Tylenol CrushableTablet, Tylenol Extra Strength, XS No Aspirin, XS Pain Reliever What should I tell my health care provider before I take this medicine? They need to know if you have any of these conditions: -if you often drink alcohol -liver disease -an unusual or allergic reaction to acetaminophen, other medicines, foods, dyes, or preservatives -pregnant or trying to get pregnant -breast-feeding How should I use this medicine? Take this medicine by mouth with a glass of water. Follow the directions on the package or prescription label. Take your medicine at regular intervals. Do not take your medicine more often than directed. Talk to your pediatrician regarding the use of this medicine in children. While this drug may be prescribed for children as young as 12 years of age for selected conditions, precautions do apply. Overdosage: If you think you have taken too much of this medicine contact a poison control center or emergency room at once. NOTE: This medicine is only for you. Do not share this medicine with others. What if I miss a dose? If you miss a dose, take it as soon as you can. If it is almost time for your next dose, take only that dose. Do not take double or extra doses. What may interact with this medicine? -alcohol -imatinib -isoniazid -other medicines with acetaminophen This list may not describe all possible interactions. Give your health care provider a list of all the medicines, herbs,  non-prescription drugs, or dietary supplements you use. Also tell them if you smoke, drink alcohol, or use illegal drugs. Some items may interact with your medicine. What should I watch for while using this medicine? Tell your doctor or health care professional if the pain lasts more than 10 days (5 days for children), if it gets worse, or if there is a new or different kind of pain. Also, check with your doctor if a fever lasts for more than 3 days. Do not take other medicines that contain acetaminophen with this medicine. Always read labels carefully. If you have questions, ask your doctor or pharmacist. If you take too much acetaminophen get medical help right away. Too much acetaminophen can be very dangerous and cause liver damage. Even if you do not have symptoms, it is important to get help right away. What side effects may I notice from receiving this medicine? Side effects that you should report to your doctor or health care professional as soon as possible: -allergic reactions like skin rash, itching or hives, swelling of the face, lips, or tongue -breathing problems -fever or sore throat -redness, blistering, peeling or loosening of the skin, including inside the mouth -trouble passing urine or change in the amount of urine -unusual bleeding or bruising -unusually weak or tired -yellowing of the eyes or skin Side effects that usually do not require medical attention (report to your doctor or health care professional if they continue or are bothersome): -headache -nausea, stomach upset This list may not describe all possible side effects. Call your doctor for medical advice about side effects. You may report side effects  to FDA at 1-800-FDA-1088. Where should I keep my medicine? Keep out of reach of children. Store at room temperature between 20 and 25 degrees C (68 and 77 degrees F). Protect from moisture and heat. Throw away any unused medicine after the expiration date. NOTE: This  sheet is a summary. It may not cover all possible information. If you have questions about this medicine, talk to your doctor, pharmacist, or health care provider.  2019 Elsevier/Gold Standard (2013-03-08 12:54:16) Cyclobenzaprine tablets What is this medicine? CYCLOBENZAPRINE (sye kloe BEN za preen) is a muscle relaxer. It is used to treat muscle pain, spasms, and stiffness. This medicine may be used for other purposes; ask your health care provider or pharmacist if you have questions. COMMON BRAND NAME(S): Fexmid, Flexeril What should I tell my health care provider before I take this medicine? They need to know if you have any of these conditions: -heart disease, irregular heartbeat, or previous heart attack -liver disease -thyroid problem -an unusual or allergic reaction to cyclobenzaprine, tricyclic antidepressants, lactose, other medicines, foods, dyes, or preservatives -pregnant or trying to get pregnant -breast-feeding How should I use this medicine? Take this medicine by mouth with a glass of water. Follow the directions on the prescription label. If this medicine upsets your stomach, take it with food or milk. Take your medicine at regular intervals. Do not take it more often than directed. Talk to your pediatrician regarding the use of this medicine in children. Special care may be needed. Overdosage: If you think you have taken too much of this medicine contact a poison control center or emergency room at once. NOTE: This medicine is only for you. Do not share this medicine with others. What if I miss a dose? If you miss a dose, take it as soon as you can. If it is almost time for your next dose, take only that dose. Do not take double or extra doses. What may interact with this medicine? Do not take this medicine with any of the following medications: -MAOIs like Carbex, Eldepryl, Marplan, Nardil, and Parnate This medicine may also interact with the following  medications: -alcohol -antihistamines for allergy, cough, and cold -certain medicines for anxiety or sleep -certain medicines for depression like amitriptyline, fluoxetine, sertraline -certain medicines for seizures like phenobarbital, primidone -contrast dyes -local anesthetics like lidocaine, pramoxine, tetracaine -medicines that relax muscles for surgery -narcotic medicines for pain -phenothiazines like chlorpromazine, mesoridazine, prochlorperazine This list may not describe all possible interactions. Give your health care provider a list of all the medicines, herbs, non-prescription drugs, or dietary supplements you use. Also tell them if you smoke, drink alcohol, or use illegal drugs. Some items may interact with your medicine. What should I watch for while using this medicine? Tell your doctor or health care professional if your symptoms do not start to get better or if they get worse. You may get drowsy or dizzy. Do not drive, use machinery, or do anything that needs mental alertness until you know how this medicine affects you. Do not stand or sit up quickly, especially if you are an older patient. This reduces the risk of dizzy or fainting spells. Alcohol may interfere with the effect of this medicine. Avoid alcoholic drinks. If you are taking another medicine that also causes drowsiness, you may have more side effects. Give your health care provider a list of all medicines you use. Your doctor will tell you how much medicine to take. Do not take more medicine than directed. Call emergency  for help if you have problems breathing or unusual sleepiness. Your mouth may get dry. Chewing sugarless gum or sucking hard candy, and drinking plenty of water may help. Contact your doctor if the problem does not go away or is severe. What side effects may I notice from receiving this medicine? Side effects that you should report to your doctor or health care professional as soon as  possible: -allergic reactions like skin rash, itching or hives, swelling of the face, lips, or tongue -breathing problems -chest pain -fast, irregular heartbeat -hallucinations -seizures -unusually weak or tired Side effects that usually do not require medical attention (report to your doctor or health care professional if they continue or are bothersome): -headache -nausea, vomiting This list may not describe all possible side effects. Call your doctor for medical advice about side effects. You may report side effects to FDA at 1-800-FDA-1088. Where should I keep my medicine? Keep out of the reach of children. Store at room temperature between 15 and 30 degrees C (59 and 86 degrees F). Keep container tightly closed. Throw away any unused medicine after the expiration date. NOTE: This sheet is a summary. It may not cover all possible information. If you have questions about this medicine, talk to your doctor, pharmacist, or health care provider.  2019 Elsevier/Gold Standard (2017-05-07 13:04:35) Muscle Strain A muscle strain is an injury that happens when a muscle is stretched longer than normal. This can happen during a fall, sports, or lifting. This can tear some muscle fibers. Usually, recovery from muscle strain takes 1-2 weeks. Complete healing normally takes 5-6 weeks. This condition is first treated with PRICE therapy. This involves:  Protecting your muscle from being injured again.  Resting your injured muscle.  Icing your injured muscle.  Applying pressure (compression) to your injured muscle. This may be done with a splint or elastic bandage.  Raising (elevating) your injured muscle. Your doctor may also recommend medicine for pain. Follow these instructions at home: If you have a splint:  Wear the splint as told by your doctor. Take it off only as told by your doctor.  Loosen the splint if your fingers or toes tingle, get numb, or turn cold and blue.  Keep the splint  clean.  If the splint is not waterproof: ? Do not let it get wet. ? Cover it with a watertight covering when you take a bath or a shower. Managing pain, stiffness, and swelling   If directed, put ice on your injured area. ? If you have a removable splint, take it off as told by your doctor. ? Put ice in a plastic bag. ? Place a towel between your skin and the bag. ? Leave the ice on for 20 minutes, 2-3 times a day.  Move your fingers or toes often. This helps to avoid stiffness and lessen swelling.  Raise your injured area above the level of your heart while you are sitting or lying down.  Wear an elastic bandage as told by your doctor. Make sure it is not too tight. General instructions  Take over-the-counter and prescription medicines only as told by your doctor.  Limit your activity. Rest your injured muscle as told by your doctor. Your doctor may say that gentle movements are okay.  If physical therapy was prescribed, do exercises as told by your doctor.  Do not put pressure on any part of the splint until it is fully hardened. This may take many hours.  Do not use any products that  contain nicotine or tobacco, such as cigarettes and e-cigarettes. These can delay bone healing. If you need help quitting, ask your doctor.  Warm up before you exercise. This helps to prevent more muscle strains.  Ask your doctor when it is safe to drive if you have a splint.  Keep all follow-up visits as told by your doctor. This is important. Contact a doctor if:  You have more pain or swelling in your injured area. Get help right away if:  You have any of these problems in your injured area: ? You have numbness. ? You have tingling. ? You lose a lot of strength. Summary  A muscle strain is an injury that happens when a muscle is stretched longer than normal.  This condition is first treated with PRICE therapy. This includes protecting, resting, icing, adding pressure, and raising  your injury.  Limit your activity. Rest your injured muscle as told by your doctor. Your doctor may say that gentle movements are okay.  Warm up before you exercise. This helps to prevent more muscle strains. This information is not intended to replace advice given to you by your health care provider. Make sure you discuss any questions you have with your health care provider. Document Released: 04/23/2008 Document Revised: 08/21/2016 Document Reviewed: 08/21/2016 Elsevier Interactive Patient Education  2019 [employer].

## 2018-10-23 ENCOUNTER — Ambulatory Visit: Payer: Self-pay | Admitting: Family Medicine

## 2018-10-26 ENCOUNTER — Ambulatory Visit: Payer: Self-pay | Admitting: Family Medicine

## 2018-10-29 ENCOUNTER — Other Ambulatory Visit: Payer: Self-pay | Admitting: Family Medicine

## 2018-11-04 ENCOUNTER — Telehealth: Payer: Self-pay

## 2018-11-04 MED ORDER — AMLODIPINE BESYLATE 10 MG PO TABS: 10.0000 mg | ORAL_TABLET | Freq: Every day | ORAL | 2 refills | Status: DC

## 2018-11-04 NOTE — Telephone Encounter (Signed)
Medication sent to pharmacy  

## 2018-11-11 ENCOUNTER — Ambulatory Visit (INDEPENDENT_AMBULATORY_CARE_PROVIDER_SITE_OTHER): Payer: Managed Care, Other (non HMO) | Admitting: Family Medicine

## 2018-11-11 ENCOUNTER — Other Ambulatory Visit: Payer: Self-pay

## 2018-11-11 DIAGNOSIS — Z6838 Body mass index (BMI) 38.0-38.9, adult: Secondary | ICD-10-CM

## 2018-11-11 DIAGNOSIS — T148XXA Other injury of unspecified body region, initial encounter: Secondary | ICD-10-CM | POA: Diagnosis not present

## 2018-11-11 DIAGNOSIS — Z09 Encounter for follow-up examination after completed treatment for conditions other than malignant neoplasm: Secondary | ICD-10-CM

## 2018-11-11 DIAGNOSIS — I10 Essential (primary) hypertension: Secondary | ICD-10-CM | POA: Diagnosis not present

## 2018-11-11 NOTE — Progress Notes (Signed)
Virtual Visit via Telephone Note  I connected with Jacob Freeman on 11/11/18 at  8:40 AM EDT by telephone and verified that I am speaking with the correct person using two identifiers.   I discussed the limitations, risks, security and privacy concerns of performing an evaluation and management service by telephone and the availability of in person appointments. I also discussed with the patient that there may be a patient responsible charge related to this service. The patient expressed understanding and agreed to proceed.   History of Present Illness:  Past Medical History:  Diagnosis Date  . Chest pain, unspecified   . Esophageal reflux   . Hematuria   . Hypercholesteremia   . Hypertension   . Nephrolithiasis   . Pain in joint, hand     Current Outpatient Medications on File Prior to Visit  Medication Sig Dispense Refill  . acetaminophen (TYLENOL) 500 MG tablet Take 500 mg by mouth every 6 (six) hours as needed for mild pain.    Marland Kitchen amLODipine (NORVASC) 10 MG tablet Take 1 tablet (10 mg total) by mouth daily. 30 tablet 2  . cyclobenzaprine (FLEXERIL) 10 MG tablet Take 1 tablet (10 mg total) by mouth 2 (two) times daily. 30 tablet 2  . ibuprofen (ADVIL,MOTRIN) 200 MG tablet Take 800 mg by mouth every 6 (six) hours as needed for mild pain.     Marland Kitchen neomycin-bacitracin-polymyxin (NEOSPORIN) [phone] ointment Apply topically 4 (four) times daily. 28.3 g 1  . oxyCODONE-acetaminophen (PERCOCET) 5-325 MG tablet Take 1-2 tablets by mouth every 4 (four) hours as needed. 20 tablet 0   No current facility-administered medications on file prior to visit.     Current Status: Since his last office visit, he is doing well with no complaints. He most recent home blood pressure readings are 130/80's. He denies visual changes, chest pain, cough, shortness of breath, heart palpitations, and falls. He has occasional headaches and dizziness with position changes. Denies severe headaches, confusion,  seizures, double vision, and blurred vision, nausea and vomiting.  He denies fevers, chills, fatigue, recent infections, weight loss, and night sweats. No reports of GI problems such as nausea, vomiting, diarrhea, and constipation. He has no reports of blood in stools, dysuria and hematuria. No depression or anxiety reported. He denies pain today.   Observations/Objective:  Telephone Virtual Visit   Assessment and Plan:  1. Essential hypertension Blood pressures remain stable. He will continue Amlodipine as prescribed. He will continue to decrease high sodium intake, excessive alcohol intake, increase potassium intake, smoking cessation, and increase physical activity of at least 30 minutes of cardio activity daily. He will continue to follow Heart Healthy or DASH diet.  2. Muscle strain Resolved.   3. Class 2 severe obesity with serious comorbidity and body mass index (BMI) of 38.0 to 38.9 in adult, unspecified obesity type (HCC) Goal BMI  is <30. Encouraged efforts to reduce weight include engaging in physical activity as tolerated with goal of 150 minutes per week. Improve dietary choices and eat a meal regimen consistent with a Mediterranean or DASH diet. Reduce simple carbohydrates. Do not skip meals and eat healthy snacks throughout the day to avoid over-eating at dinner. Set a goal weight loss that is achievable for you.     No orders of the defined types were placed in this encounter.   No orders of the defined types were placed in this encounter.   Referral Orders  No referral(s) requested today    Raliegh Ip,  MSN,  FNP-C [facility] [address]  [place], [place] [id number] [phone]  Follow Up Instructions:  He will follow up in 6 months.     I discussed the assessment and treatment plan with the patient. The patient was provided an opportunity to ask questions and all were answered. The patient agreed with the plan  and demonstrated an understanding of the instructions.   The patient was advised to call back or seek an in-person evaluation if the symptoms worsen or if the condition fails to improve as anticipated.  I provided 15-20 minutes of non-face-to-face time during this encounter.   Kallie LocksNatalie M Nefertiti Mohamad, FNP

## 2018-12-11 IMAGING — CR DG CHEST 2V
2 series · 2 of 2 positions shown · non-contrast
Comparison: 08/30/2017

CLINICAL DATA: Left-sided pneumonia.

EXAM:
CHEST - 2 VIEW

[w chest pa]
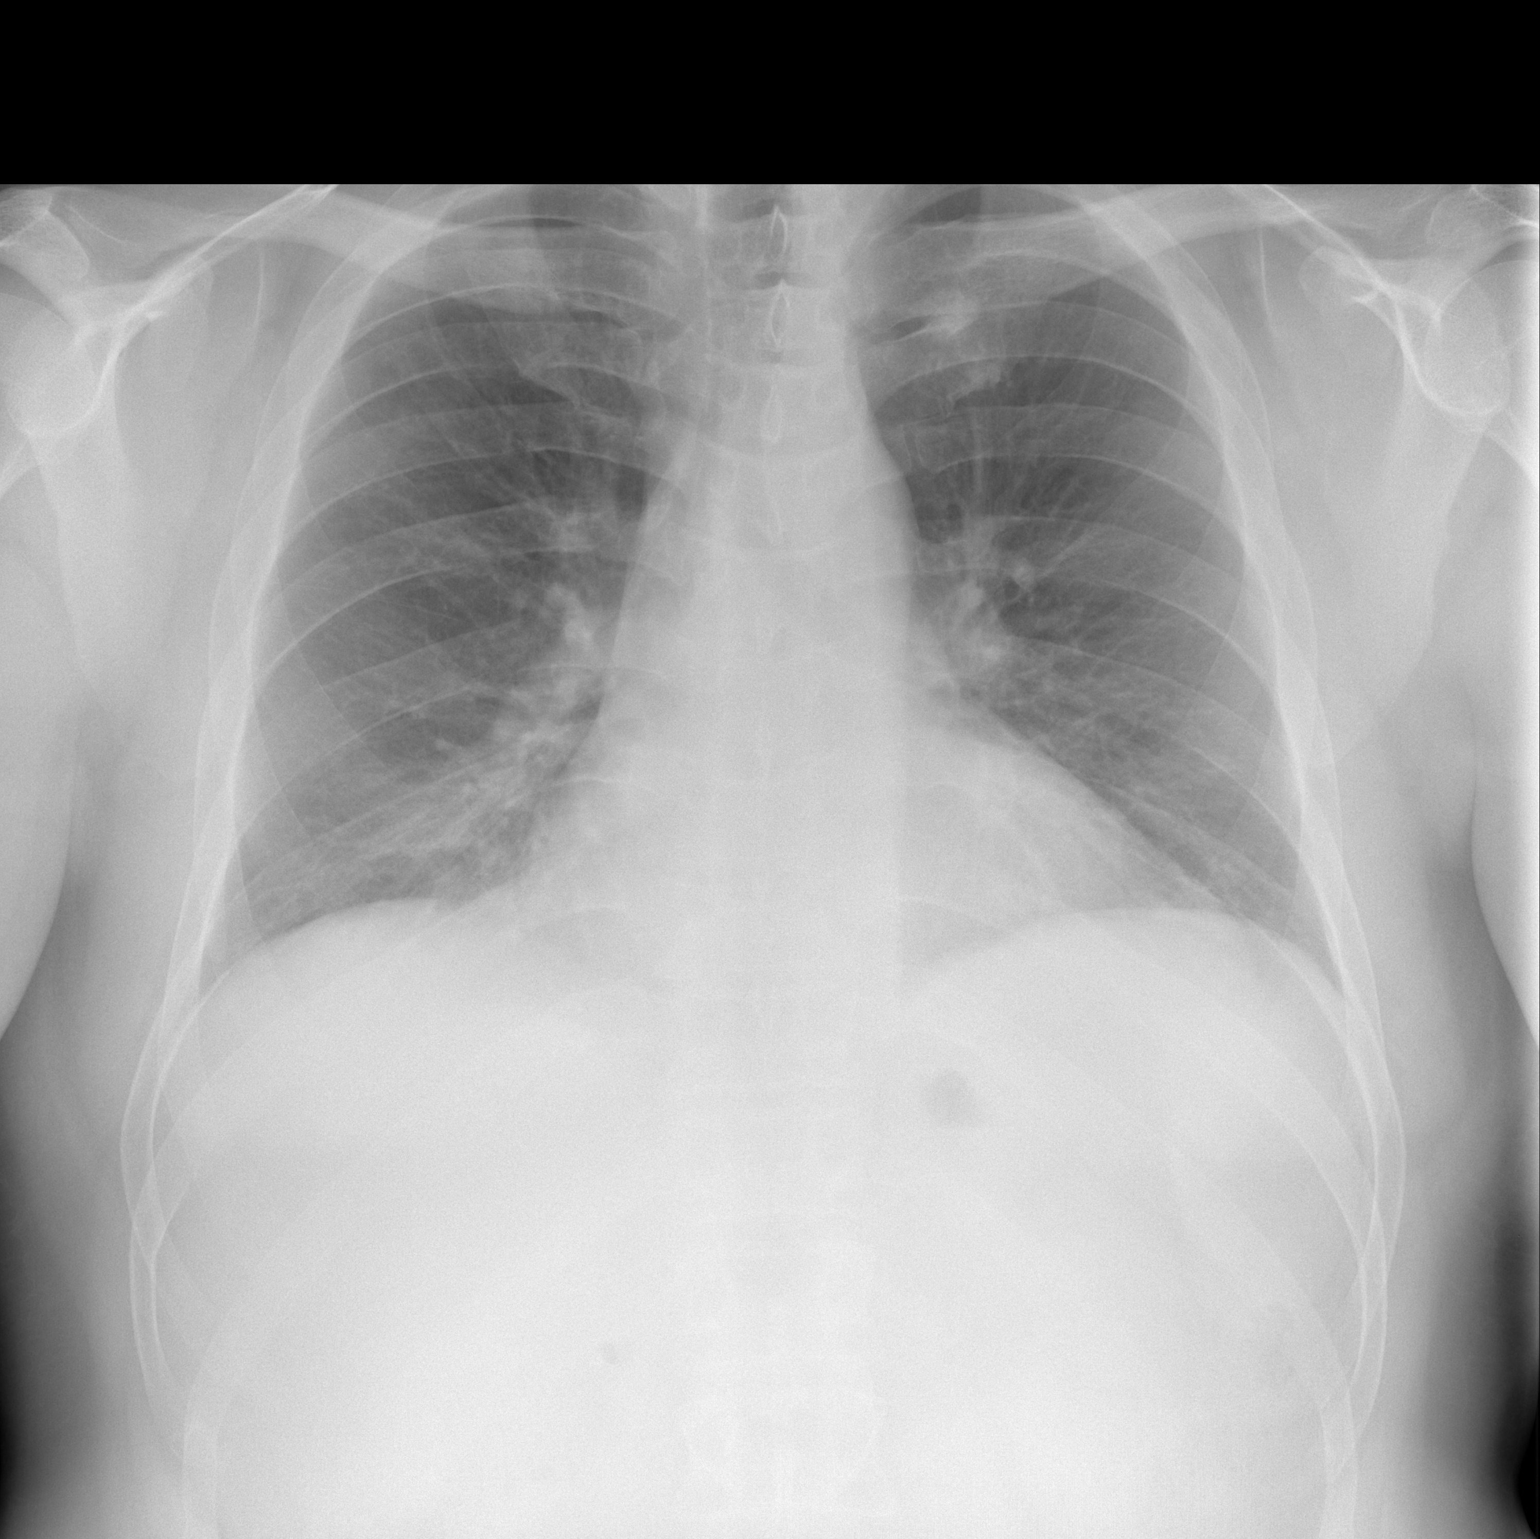

[w chest lat]
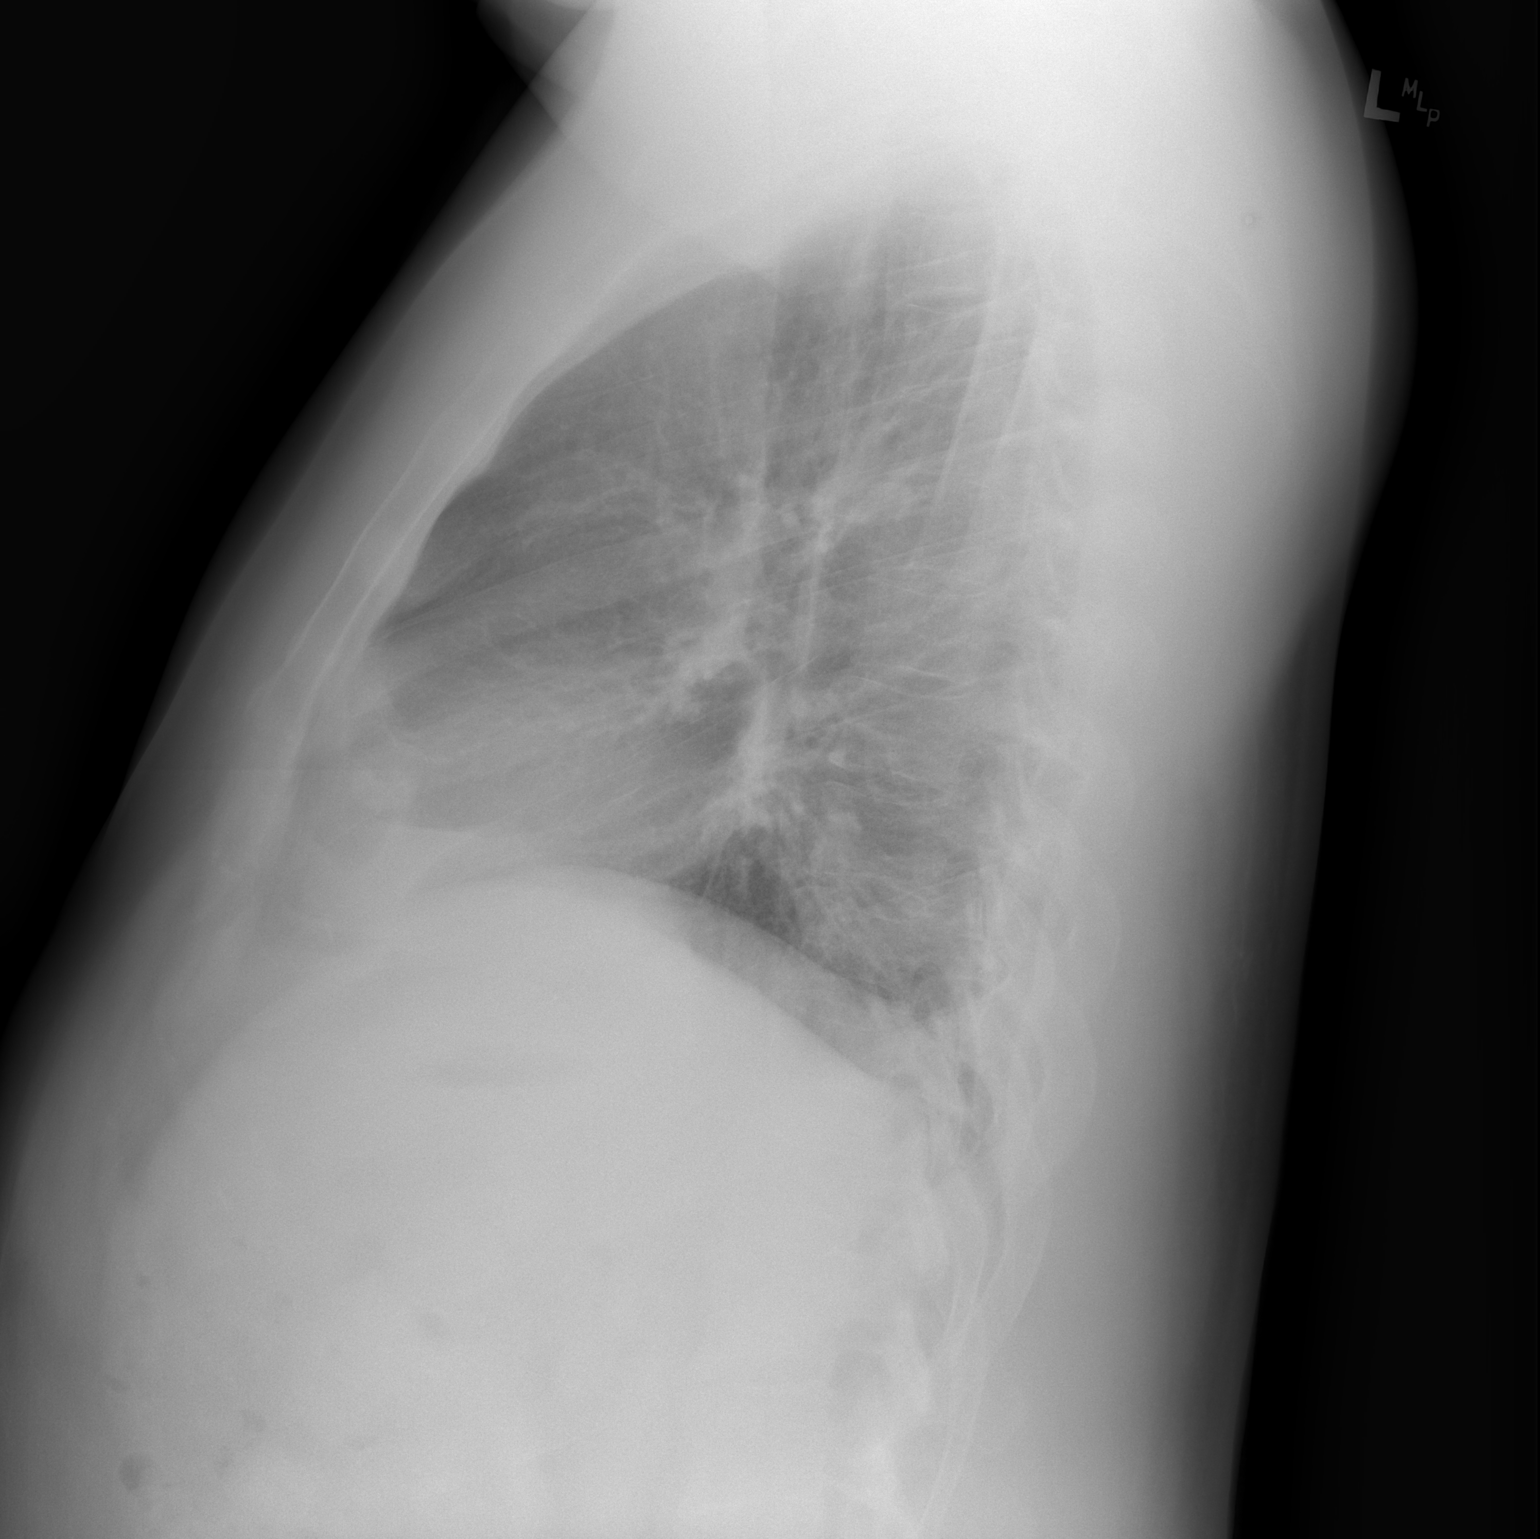

[2 of 2 positions shown; findings below may reference images not displayed]

FINDINGS: Cardiomediastinal silhouette is normal. Mediastinal contours appear
intact.

There is no evidence of focal airspace consolidation, pleural
effusion or pneumothorax. Mild crowding of the interstitial
markings.

Osseous structures are without acute abnormality. Soft tissues are
grossly normal.
IMPRESSION: Mild crowding of the interstitial markings which may be due to
hypoinflation or bronchitis/bronchiolitis.

No evidence of lobar consolidation.

## 2019-01-27 DIAGNOSIS — E559 Vitamin D deficiency, unspecified: Secondary | ICD-10-CM

## 2019-01-27 HISTORY — DX: Vitamin D deficiency, unspecified: E55.9

## 2019-02-05 ENCOUNTER — Encounter: Payer: Self-pay | Admitting: Family Medicine

## 2019-02-05 ENCOUNTER — Other Ambulatory Visit: Payer: Self-pay

## 2019-02-05 ENCOUNTER — Ambulatory Visit (INDEPENDENT_AMBULATORY_CARE_PROVIDER_SITE_OTHER): Payer: Managed Care, Other (non HMO) | Admitting: Family Medicine

## 2019-02-05 VITALS — BP 132/86 | HR 88 | Temp 97.8°F | Ht 71.0 in | Wt 273.0 lb

## 2019-02-05 DIAGNOSIS — K5900 Constipation, unspecified: Secondary | ICD-10-CM | POA: Insufficient documentation

## 2019-02-05 DIAGNOSIS — Z09 Encounter for follow-up examination after completed treatment for conditions other than malignant neoplasm: Secondary | ICD-10-CM

## 2019-02-05 DIAGNOSIS — R110 Nausea: Secondary | ICD-10-CM | POA: Insufficient documentation

## 2019-02-05 DIAGNOSIS — I10 Essential (primary) hypertension: Secondary | ICD-10-CM

## 2019-02-05 DIAGNOSIS — Z6838 Body mass index (BMI) 38.0-38.9, adult: Secondary | ICD-10-CM

## 2019-02-05 DIAGNOSIS — Z23 Encounter for immunization: Secondary | ICD-10-CM

## 2019-02-05 LAB — POCT URINALYSIS DIP (MANUAL ENTRY)
Bilirubin, UA: NEGATIVE
Blood, UA: NEGATIVE
Glucose, UA: NEGATIVE mg/dL
Ketones, POC UA: NEGATIVE mg/dL
Leukocytes, UA: NEGATIVE
Nitrite, UA: NEGATIVE
Protein Ur, POC: NEGATIVE mg/dL
Spec Grav, UA: 1.02 (ref 1.010–1.025)
Urobilinogen, UA: 1 E.U./dL
pH, UA: 7 (ref 5.0–8.0)

## 2019-02-05 MED ORDER — DOCUSATE SODIUM 50 MG PO CAPS: 50.0000 mg | ORAL_CAPSULE | Freq: Two times a day (BID) | ORAL | 3 refills | Status: DC

## 2019-02-05 MED ORDER — POLYETHYLENE GLYCOL 3350 17 GM/SCOOP PO POWD: 17.0000 g | Freq: Two times a day (BID) | ORAL | 1 refills | Status: DC | PRN

## 2019-02-05 NOTE — Patient Instructions (Signed)
Constipation, Adult Constipation is when a person:  Poops (has a bowel movement) fewer times in a week than normal.  Has a hard time pooping.  Has poop that is dry, hard, or bigger than normal. Follow these instructions at home: Eating and drinking   Eat foods that have a lot of fiber, such as: ? Fresh fruits and vegetables. ? Whole grains. ? Beans.  Eat less of foods that are high in fat, low in fiber, or overly processed, such as: ? [place] fries. ? Hamburgers. ? Cookies. ? Candy. ? Soda.  Drink enough fluid to keep your pee (urine) clear or pale yellow. General instructions  Exercise regularly or as told by your doctor.  Go to the restroom when you feel like you need to poop. Do not hold it in.  Take over-the-counter and prescription medicines only as told by your doctor. These include any fiber supplements.  Do pelvic floor retraining exercises, such as: ? Doing deep breathing while relaxing your lower belly (abdomen). ? Relaxing your pelvic floor while pooping.  Watch your condition for any changes.  Keep all follow-up visits as told by your doctor. This is important. Contact a doctor if:  You have pain that gets worse.  You have a fever.  You have not pooped for 4 days.  You throw up (vomit).  You are not hungry.  You lose weight.  You are bleeding from the anus.  You have thin, pencil-like poop (stool). Get help right away if:  You have a fever, and your symptoms suddenly get worse.  You leak poop or have blood in your poop.  Your belly feels hard or bigger than normal (is bloated).  You have very bad belly pain.  You feel dizzy or you faint. This information is not intended to replace advice given to you by your health care provider. Make sure you discuss any questions you have with your health care provider. Document Released: 01/01/2008 Document Revised: 06/27/2017 Document Reviewed: 01/03/2016 Elsevier Patient Education  2020 Elsevier  Inc. Docusate capsules What is this medicine? DOCUSATE (doc CUE sayt) is stool softener. It helps prevent constipation and straining or discomfort associated with hard or dry stools. This medicine may be used for other purposes; ask your health care provider or pharmacist if you have questions. COMMON BRAND NAME(S): Colace, Colace Clear, Correctol, D.O.S., DC, Doc-Q-Lace, DocuLace, Docusoft S, DOK, DOK Extra Strength, Dulcolax, Genasoft, Kao-Tin, Kaopectate Liqui-Gels, Phillips Stool Softener, Stool Softener, Stool Softner DC, Sulfolax, Sur-Q-Lax, Surfak, Uni-Ease What should I tell my health care provider before I take this medicine? They need to know if you have any of these conditions:  nausea or vomiting  severe constipation  stomach pain  sudden change in bowel habit lasting more than 2 weeks  an unusual or allergic reaction to docusate, other medicines, foods, dyes, or preservatives  pregnant or trying to get pregnant  breast-feeding How should I use this medicine? Take this medicine by mouth with a glass of water. Follow the directions on the label. Take your doses at regular intervals. Do not take your medicine more often than directed. Talk to your pediatrician regarding the use of this medicine in children. While this medicine may be prescribed for children as young as 2 years for selected conditions, precautions do apply. Overdosage: If you think you have taken too much of this medicine contact a poison control center or emergency room at once. NOTE: This medicine is only for you. Do not share this medicine with  others. What if I miss a dose? If you miss a dose, take it as soon as you can. If it is almost time for your next dose, take only that dose. Do not take double or extra doses. What may interact with this medicine?  mineral oil This list may not describe all possible interactions. Give your health care provider a list of all the medicines, herbs, non-prescription  drugs, or dietary supplements you use. Also tell them if you smoke, drink alcohol, or use illegal drugs. Some items may interact with your medicine. What should I watch for while using this medicine? Do not use for more than one week without advice from your doctor or health care professional. If your constipation returns, check with your doctor or health care professional. Drink plenty of water while taking this medicine. Drinking water helps decrease constipation. Stop using this medicine and contact your doctor or health care professional if you experience any rectal bleeding or do not have a bowel movement after use. These could be signs of a more serious condition. What side effects may I notice from receiving this medicine? Side effects that you should report to your doctor or health care professional as soon as possible:  allergic reactions like skin rash, itching or hives, swelling of the face, lips, or tongue Side effects that usually do not require medical attention (report to your doctor or health care professional if they continue or are bothersome):  diarrhea  stomach cramps  throat irritation This list may not describe all possible side effects. Call your doctor for medical advice about side effects. You may report side effects to FDA at 1-800-FDA-1088. Where should I keep my medicine? Keep out of the reach of children. Store at room temperature between 15 and 30 degrees C (59 and 86 degrees F). Throw away any unused medicine after the expiration date. NOTE: This sheet is a summary. It may not cover all possible information. If you have questions about this medicine, talk to your doctor, pharmacist, or health care provider.  2020 Elsevier/Gold Standard (2007-11-05 15:56:49) Polyethylene Glycol powder What is this medicine? POLYETHYLENE GLYCOL 3350 (pol ee ETH i leen; GLYE col) powder is a laxative used to treat constipation. It increases the amount of water in the stool. Bowel  movements become easier and more frequent. This medicine may be used for other purposes; ask your health care provider or pharmacist if you have questions. COMMON BRAND NAME(S): Sharlyn Bologna, GlycoLax, Healthylax, MiraLax, Smooth LAX, Vita Health What should I tell my health care provider before I take this medicine? They need to know if you have any of these conditions:  a history of blockage of the stomach or intestine  current abdomen distension or pain  difficulty swallowing  diverticulitis, ulcerative colitis, or other chronic bowel disease  phenylketonuria  an unusual or allergic reaction to polyethylene glycol, other medicines, dyes, or preservatives  pregnant or trying to get pregnant  breast-feeding How should I use this medicine? Take this medicine by mouth. The bottle has a measuring cap that is marked with a line. Pour the powder into the cap up to the marked line (the dose is about 1 heaping tablespoon). Add the powder in the cap to a full glass (4 to 8 ounces or 120 to 240 mL) of water, juice, soda, coffee or tea. Mix the powder well. Ensure that the powder is fully dissolved. Do not drink if there are any clumps. Drink the solution. Take exactly as directed. Do not  take your medicine more often than directed. Talk to your pediatrician regarding the use of this medicine in children. Special care may be needed. Overdosage: If you think you have taken too much of this medicine contact a poison control center or emergency room at once. NOTE: This medicine is only for you. Do not share this medicine with others. What if I miss a dose? If you miss a dose, take it as soon as you can. If it is almost time for your next dose, take only that dose. Do not take double or extra doses. What may interact with this medicine? Interactions are not expected. This list may not describe all possible interactions. Give your health care provider a list of all the medicines, herbs,  non-prescription drugs, or dietary supplements you use. Also tell them if you smoke, drink alcohol, or use illegal drugs. Some items may interact with your medicine. What should I watch for while using this medicine? Do not use for more than 2 weeks without advice from your doctor or health care professional. It can take 2 to 4 days to have a bowel movement and to experience improvement in constipation. See your health care professional for any changes in bowel habits, including constipation, that are severe or last longer than three weeks. Always take this medicine with plenty of water. What side effects may I notice from receiving this medicine? Side effects that you should report to your doctor or health care professional as soon as possible:  diarrhea  difficulty breathing  itching of the skin, hives, or skin rash  severe bloating, pain, or distension of the stomach  vomiting Side effects that usually do not require medical attention (report to your doctor or health care professional if they continue or are bothersome):  bloating or gas  lower abdominal discomfort or cramps  nausea This list may not describe all possible side effects. Call your doctor for medical advice about side effects. You may report side effects to FDA at 1-800-FDA-1088. Where should I keep my medicine? Keep out of the reach of children. Store between 15 and 30 degrees C (59 and 86 degrees F). Throw away any unused medicine after the expiration date. NOTE: This sheet is a summary. It may not cover all possible information. If you have questions about this medicine, talk to your doctor, pharmacist, or health care provider.  2020 Elsevier/Gold Standard (2018-01-01 10:42:01)

## 2019-02-05 NOTE — Progress Notes (Signed)
[facility]   Established Patient Office Visit  Subjective:  Patient ID: Jacob Freeman, male    DOB: 05/19/1971  Age: 48 y.o. MRN: [id number]  CC:  Chief Complaint  Patient presents with  . Constipation  . Emesis    HPI Jacob Freeman is a 48 year old male who presents for follow up today.   Past Medical History:  Diagnosis Date  . Chest pain, unspecified   . Esophageal reflux   . Hematuria   . Hypercholesteremia   . Hypertension   . Nephrolithiasis   . Pain in joint, hand    Current Status: Since her last office visit, she is doing well with no complaints. He denies visual changes, chest pain, cough, shortness of breath, heart palpitations, and falls. He has occasional headaches and dizziness with position changes. Denies severe headaches, confusion, seizures, double vision, and blurred vision, and vomiting. He reports mild constipation and nausea X 2 days ago, which he r/t recent chicken nuggets eaten. He denies fevers, chills, fatigue, recent infections, weight loss, and night sweats. No reports of GI problems such as diarrhea.  He has no reports of blood in stools, dysuria and hematuria. No depression or anxiety reported today.   Past Surgical History:  Procedure Laterality Date  . TONSILLECTOMY      Family History  Problem Relation Age of Onset  . Brain cancer Father   . Hypertension Mother     Social History   Socioeconomic History  . Marital status: Married    Spouse name: Not on file  . Number of children: Not on file  . Years of education: Not on file  . Highest education level: Not on file  Occupational History  . Not on file  Social Needs  . Financial resource strain: Not on file  . Food insecurity    Worry: Not on file    Inability: Not on file  . Transportation needs    Medical: Not on file    Non-medical: Not on file  Tobacco Use  . Smoking status: Never Smoker  . Smokeless tobacco: Never Used   Substance and Sexual Activity  . Alcohol use: No  . Drug use: No  . Sexual activity: Not on file  Lifestyle  . Physical activity    Days per week: Not on file    Minutes per session: Not on file  . Stress: Not on file  Relationships  . Social Musicianconnections    Talks on phone: Not on file    Gets together: Not on file    Attends religious service: Not on file    Active member of club or organization: Not on file    Attends meetings of clubs or organizations: Not on file    Relationship status: Not on file  . Intimate partner violence    Fear of current or ex partner: Not on file    Emotionally abused: Not on file    Physically abused: Not on file    Forced sexual activity: Not on file  Other Topics Concern  . Not on file  Social History Narrative  . Not on file    Outpatient Medications Prior to Visit  Medication Sig Dispense Refill  . amLODipine (NORVASC) 10 MG tablet Take 1 tablet (10 mg total) by mouth daily. 30 tablet 2  . acetaminophen (TYLENOL) 500 MG tablet Take 500 mg by mouth every 6 (six) hours as needed for mild pain.    .Marland Kitchen  cyclobenzaprine (FLEXERIL) 10 MG tablet Take 1 tablet (10 mg total) by mouth 2 (two) times daily. (Patient not taking: Reported on 02/05/2019) 30 tablet 2  . ibuprofen (ADVIL,MOTRIN) 200 MG tablet Take 800 mg by mouth every 6 (six) hours as needed for mild pain.     Marland Kitchen. oxyCODONE-acetaminophen (PERCOCET) 5-325 MG tablet Take 1-2 tablets by mouth every 4 (four) hours as needed. (Patient not taking: Reported on 02/05/2019) 20 tablet 0  . neomycin-bacitracin-polymyxin (NEOSPORIN) [phone] ointment Apply topically 4 (four) times daily. (Patient not taking: Reported on 02/05/2019) 28.3 g 1   No facility-administered medications prior to visit.     No Known Allergies  ROS Review of Systems  Constitutional: Negative.   HENT: Negative.   Eyes: Negative.   Respiratory: Negative.   Cardiovascular: Negative.   Gastrointestinal: Positive for abdominal  distention, constipation (X 2 days. ) and nausea (occasional).  Endocrine: Negative.   Genitourinary: Negative.   Musculoskeletal: Negative.   Allergic/Immunologic: Negative.   Neurological: Positive for dizziness (occasional) and headaches (occasional ).  Psychiatric/Behavioral: Negative.     Objective:    Physical Exam  Constitutional: He is oriented to person, place, and time. He appears well-developed and well-nourished.  HENT:  Head: Normocephalic and atraumatic.  Eyes: Conjunctivae are normal.  Neck: Normal range of motion. Neck supple.  Cardiovascular: Normal rate, regular rhythm, normal heart sounds and intact distal pulses.  Pulmonary/Chest: Effort normal and breath sounds normal.  Abdominal: Soft. Bowel sounds are normal.  Musculoskeletal: Normal range of motion.  Neurological: He is alert and oriented to person, place, and time. He has normal reflexes.  Skin: Skin is warm and dry.  Psychiatric: He has a normal mood and affect. His behavior is normal. Judgment and thought content normal.  Nursing note and vitals reviewed.   BP 132/86   Pulse 88   Temp 97.8 F (36.6 C) (Oral)   Ht 5\' 11"  (1.803 m)   Wt 273 lb (123.8 kg)   SpO2 98%   BMI 38.08 kg/m  Wt Readings from Last 3 Encounters:  02/05/19 273 lb (123.8 kg)  09/23/18 268 lb (121.6 kg)  09/09/18 270 lb (122.5 kg)     There are no preventive care reminders to display for this patient.  There are no preventive care reminders to display for this patient.  Lab Results  Component Value Date   TSH 2.110 09/05/2017   Lab Results  Component Value Date   WBC 11.4 (H) 12/16/2017   HGB 14.8 12/16/2017   HCT 45.7 12/16/2017   MCV 81.8 12/16/2017   PLT 227 12/16/2017   Lab Results  Component Value Date   NA 140 12/16/2017   K 3.7 12/16/2017   CO2 26 12/16/2017   GLUCOSE 114 (H) 12/16/2017   BUN 11 12/16/2017   CREATININE 1.46 (H) 12/16/2017   BILITOT 1.4 (H) 12/16/2017   ALKPHOS 75 12/16/2017   AST  20 12/16/2017   ALT 33 12/16/2017   PROT 7.9 12/16/2017   ALBUMIN 4.1 12/16/2017   CALCIUM 9.1 12/16/2017   ANIONGAP 11 12/16/2017   Lab Results  Component Value Date   CHOL 182 09/05/2017   Lab Results  Component Value Date   HDL 55 09/05/2017   Lab Results  Component Value Date   LDLCALC 114 (H) 09/05/2017   Lab Results  Component Value Date   TRIG 66 09/05/2017   Lab Results  Component Value Date   CHOLHDL 3.3 09/05/2017   Lab Results  Component Value  Date   HGBA1C 5.6 08/26/2018      Assessment & Plan:   1. Essential hypertension The current medical regimen is effective; blood pressure is stable at 132/86 today; continue present plan and medications as prescribed. He will continue to decrease high sodium intake, excessive alcohol intake, increase potassium intake, smoking cessation, and increase physical activity of at least 30 minutes of cardio activity daily. He will continue to follow Heart Healthy or DASH diet. - POCT urinalysis dipstick - CBC with Differential - Comprehensive metabolic panel - Lipid Panel - TSH - Vitamin D, 25-hydroxy - Vitamin B12  2. Constipation, unspecified constipation type We will initiate Colace today.  - docusate sodium (COLACE) 50 MG capsule; Take 1 capsule (50 mg total) by mouth 2 (two) times daily.  Dispense: 30 capsule; Refill: 3 - polyethylene glycol powder (GLYCOLAX/MIRALAX) 17 GM/SCOOP powder; Take 17 g by mouth 2 (two) times daily as needed.  Dispense: 3350 g; Refill: 1  3. Nausea Stable today.   4. Class 2 severe obesity with serious comorbidity and body mass index (BMI) of 38.0 to 38.9 in adult, unspecified obesity type ([place]) Body mass index is 38.08 kg/m.  Goal BMI  is <30. Encouraged efforts to reduce weight include engaging in physical activity as tolerated with goal of 150 minutes per week. Improve dietary choices and eat a meal regimen consistent with a Mediterranean or DASH diet. Reduce simple carbohydrates. Do  not skip meals and eat healthy snacks throughout the day to avoid over-eating at dinner. Set a goal weight loss that is achievable for you.  5. Need for Tdap vaccination - Tdap vaccine greater than or equal to 7yo IM  6. Follow up He will follow up in 6 months.   Meds ordered this encounter  Medications  . docusate sodium (COLACE) 50 MG capsule    Sig: Take 1 capsule (50 mg total) by mouth 2 (two) times daily.    Dispense:  30 capsule    Refill:  3  . polyethylene glycol powder (GLYCOLAX/MIRALAX) 17 GM/SCOOP powder    Sig: Take 17 g by mouth 2 (two) times daily as needed.    Dispense:  3350 g    Refill:  1    Orders Placed This Encounter  Procedures  . Tdap vaccine greater than or equal to 7yo IM  . CBC with Differential  . Comprehensive metabolic panel  . Lipid Panel  . TSH  . Vitamin D, 25-hydroxy  . Vitamin B12  . POCT urinalysis dipstick    Referral Orders  No referral(s) requested today    Kathe Becton,  MSN, FNP-BC [facility] [place], [place] [phone]  Problem List Items Addressed This Visit      Cardiovascular and Mediastinum   Essential hypertension - Primary   Relevant Orders   POCT urinalysis dipstick (Completed)   CBC with Differential   Comprehensive metabolic panel   Lipid Panel   TSH   Vitamin D, 25-hydroxy   Vitamin B12     Other   Class 2 severe obesity with serious comorbidity and body mass index (BMI) of 38.0 to 38.9 in adult [facility])    Other Visit Diagnoses    Constipation, unspecified constipation type       Relevant Medications   docusate sodium (COLACE) 50 MG capsule   polyethylene glycol powder (GLYCOLAX/MIRALAX) 17 GM/SCOOP powder   Nausea       Need for Tdap vaccination  Relevant Orders   Tdap vaccine greater than or equal to 7yo IM (Completed)   Follow up          Meds ordered this encounter  Medications  . docusate sodium (COLACE) 50 MG capsule     Sig: Take 1 capsule (50 mg total) by mouth 2 (two) times daily.    Dispense:  30 capsule    Refill:  3  . polyethylene glycol powder (GLYCOLAX/MIRALAX) 17 GM/SCOOP powder    Sig: Take 17 g by mouth 2 (two) times daily as needed.    Dispense:  3350 g    Refill:  1    Follow-up: Return in about 6 months (around 08/08/2019).    Kallie LocksNatalie M Anuj Summons, FNP

## 2019-02-06 LAB — COMPREHENSIVE METABOLIC PANEL
ALT: 22 IU/L (ref 0–44)
AST: 19 IU/L (ref 0–40)
Albumin/Globulin Ratio: 1.8 (ref 1.2–2.2)
Albumin: 4.2 g/dL (ref 4.0–5.0)
Alkaline Phosphatase: 99 IU/L (ref 39–117)
BUN/Creatinine Ratio: 6 — ABNORMAL LOW (ref 9–20)
BUN: 8 mg/dL (ref 6–24)
Bilirubin Total: 0.6 mg/dL (ref 0.0–1.2)
CO2: 24 mmol/L (ref 20–29)
Calcium: 9.5 mg/dL (ref 8.7–10.2)
Chloride: 106 mmol/L (ref 96–106)
Creatinine, Ser: 1.29 mg/dL — ABNORMAL HIGH (ref 0.76–1.27)
GFR calc Af Amer: 75 mL/min/{1.73_m2} (ref >59)
GFR calc non Af Amer: 65 mL/min/{1.73_m2} (ref >59)
Globulin, Total: 2.3 g/dL (ref 1.5–4.5)
Glucose: 104 mg/dL — ABNORMAL HIGH (ref 65–99)
Potassium: 4.6 mmol/L (ref 3.5–5.2)
Sodium: 144 mmol/L (ref 134–144)
Total Protein: 6.5 g/dL (ref 6.0–8.5)

## 2019-02-06 LAB — CBC WITH DIFFERENTIAL/PLATELET
Basophils Absolute: 0 10*3/uL (ref 0.0–0.2)
Basos: 1 %
EOS (ABSOLUTE): 0.2 10*3/uL (ref 0.0–0.4)
Eos: 4 %
Hematocrit: 46.7 % (ref 37.5–51.0)
Hemoglobin: 15 g/dL (ref 13.0–17.7)
Immature Grans (Abs): 0 10*3/uL (ref 0.0–0.1)
Immature Granulocytes: 0 %
Lymphocytes Absolute: 1.3 10*3/uL (ref 0.7–3.1)
Lymphs: 26 %
MCH: 25.6 pg — ABNORMAL LOW (ref 26.6–33.0)
MCHC: 32.1 g/dL (ref 31.5–35.7)
MCV: 80 fL (ref 79–97)
Monocytes Absolute: 0.4 10*3/uL (ref 0.1–0.9)
Monocytes: 8 %
Neutrophils Absolute: 3.2 10*3/uL (ref 1.4–7.0)
Neutrophils: 61 %
Platelets: 256 10*3/uL (ref 150–450)
RBC: 5.87 x10E6/uL — ABNORMAL HIGH (ref 4.14–5.80)
RDW: 14.5 % (ref 11.6–15.4)
WBC: 5.1 10*3/uL (ref 3.4–10.8)

## 2019-02-06 LAB — VITAMIN B12: Vitamin B-12: 334 pg/mL (ref 232–1245)

## 2019-02-06 LAB — LIPID PANEL
Chol/HDL Ratio: 4.1 ratio (ref 0.0–5.0)
Cholesterol, Total: 213 mg/dL — ABNORMAL HIGH (ref 100–199)
HDL: 52 mg/dL (ref >39)
LDL Calculated: 150 mg/dL — ABNORMAL HIGH (ref 0–99)
Triglycerides: 55 mg/dL (ref 0–149)
VLDL Cholesterol Cal: 11 mg/dL (ref 5–40)

## 2019-02-06 LAB — TSH: TSH: 2.19 u[IU]/mL (ref 0.450–4.500)

## 2019-02-06 LAB — VITAMIN D 25 HYDROXY (VIT D DEFICIENCY, FRACTURES): Vit D, 25-Hydroxy: 16.8 ng/mL — ABNORMAL LOW (ref 30.0–100.0)

## 2019-02-07 ENCOUNTER — Other Ambulatory Visit: Payer: Self-pay | Admitting: Family Medicine

## 2019-02-07 ENCOUNTER — Encounter: Payer: Self-pay | Admitting: Family Medicine

## 2019-02-07 DIAGNOSIS — E785 Hyperlipidemia, unspecified: Secondary | ICD-10-CM

## 2019-02-07 DIAGNOSIS — E559 Vitamin D deficiency, unspecified: Secondary | ICD-10-CM

## 2019-02-07 MED ORDER — ROSUVASTATIN CALCIUM 10 MG PO TABS: 10.0000 mg | ORAL_TABLET | Freq: Every day | ORAL | 3 refills | Status: DC

## 2019-02-07 MED ORDER — VITAMIN D (ERGOCALCIFEROL) 1.25 MG (50000 UNIT) PO CAPS: 50000.0000 [IU] | ORAL_CAPSULE | ORAL | 3 refills | Status: DC

## 2019-02-08 ENCOUNTER — Telehealth: Payer: Self-pay

## 2019-02-08 NOTE — Telephone Encounter (Signed)
Patient states that his stomach is still very tight and feels like he has over eaten. He has been able to use the bathroom. Please advise

## 2019-02-09 ENCOUNTER — Encounter: Payer: Self-pay | Admitting: Family Medicine

## 2019-02-09 ENCOUNTER — Other Ambulatory Visit: Payer: Self-pay | Admitting: Family Medicine

## 2019-02-09 NOTE — Telephone Encounter (Signed)
Patient states that he needs a note. Patient states that he was suppose to go back on 02/08/2019 but has not been able to go back because of the pain. Patient will increase his fluids and continue medication prescribe. Would like to be out until 02/12/2019.

## 2019-02-09 NOTE — Telephone Encounter (Signed)
Patient notified

## 2019-02-25 ENCOUNTER — Ambulatory Visit: Payer: Self-pay | Admitting: Family Medicine

## 2019-03-04 ENCOUNTER — Other Ambulatory Visit: Payer: Self-pay | Admitting: Family Medicine

## 2019-03-24 ENCOUNTER — Encounter (HOSPITAL_COMMUNITY): Payer: Self-pay | Admitting: Emergency Medicine

## 2019-03-24 ENCOUNTER — Emergency Department (HOSPITAL_COMMUNITY)
Admission: EM | Admit: 2019-03-24 | Discharge: 2019-03-24 | Disposition: A | Discharge status: Home / Self Care | Payer: Managed Care, Other (non HMO) | Attending: Emergency Medicine | Admitting: Emergency Medicine

## 2019-03-24 ENCOUNTER — Other Ambulatory Visit: Payer: Self-pay

## 2019-03-24 ENCOUNTER — Emergency Department (HOSPITAL_COMMUNITY): Payer: Managed Care, Other (non HMO)

## 2019-03-24 DIAGNOSIS — I10 Essential (primary) hypertension: Secondary | ICD-10-CM | POA: Insufficient documentation

## 2019-03-24 DIAGNOSIS — R110 Nausea: Secondary | ICD-10-CM | POA: Diagnosis not present

## 2019-03-24 DIAGNOSIS — R42 Dizziness and giddiness: Secondary | ICD-10-CM | POA: Insufficient documentation

## 2019-03-24 DIAGNOSIS — Z79899 Other long term (current) drug therapy: Secondary | ICD-10-CM | POA: Diagnosis not present

## 2019-03-24 DIAGNOSIS — R0602 Shortness of breath: Secondary | ICD-10-CM | POA: Diagnosis not present

## 2019-03-24 DIAGNOSIS — R109 Unspecified abdominal pain: Secondary | ICD-10-CM | POA: Diagnosis not present

## 2019-03-24 LAB — COMPREHENSIVE METABOLIC PANEL
ALT: 23 U/L (ref 0–44)
AST: 21 U/L (ref 15–41)
Albumin: 4 g/dL (ref 3.5–5.0)
Alkaline Phosphatase: 98 U/L (ref 38–126)
Anion gap: 9 (ref 5–15)
BUN: 5 mg/dL — ABNORMAL LOW (ref 6–20)
CO2: 26 mmol/L (ref 22–32)
Calcium: 9.3 mg/dL (ref 8.9–10.3)
Chloride: 105 mmol/L (ref 98–111)
Creatinine, Ser: 1.27 mg/dL — ABNORMAL HIGH (ref 0.61–1.24)
GFR calc Af Amer: >60 mL/min (ref >60)
GFR calc non Af Amer: >60 mL/min (ref >60)
Glucose, Bld: 102 mg/dL — ABNORMAL HIGH (ref 70–99)
Potassium: 4.2 mmol/L (ref 3.5–5.1)
Sodium: 140 mmol/L (ref 135–145)
Total Bilirubin: 0.6 mg/dL (ref 0.3–1.2)
Total Protein: 7.4 g/dL (ref 6.5–8.1)

## 2019-03-24 LAB — D-DIMER, QUANTITATIVE: D-Dimer, Quant: 0.3 ug/mL-FEU (ref 0.00–0.50)

## 2019-03-24 LAB — COOXEMETRY PANEL
Carboxyhemoglobin: 1.3 % (ref 0.5–1.5)
Methemoglobin: 1.1 % (ref 0.0–1.5)
O2 Saturation: 71.1 %
Total hemoglobin: 14.4 g/dL (ref 12.0–16.0)

## 2019-03-24 LAB — CBC WITH DIFFERENTIAL/PLATELET
Abs Immature Granulocytes: 0.01 10*3/uL (ref 0.00–0.07)
Basophils Absolute: 0 10*3/uL (ref 0.0–0.1)
Basophils Relative: 1 %
Eosinophils Absolute: 0.1 10*3/uL (ref 0.0–0.5)
Eosinophils Relative: 2 %
HCT: 46.1 % (ref 39.0–52.0)
Hemoglobin: 14.6 g/dL (ref 13.0–17.0)
Immature Granulocytes: 0 %
Lymphocytes Relative: 24 %
Lymphs Abs: 1.3 10*3/uL (ref 0.7–4.0)
MCH: 26 pg (ref 26.0–34.0)
MCHC: 31.7 g/dL (ref 30.0–36.0)
MCV: 82 fL (ref 80.0–100.0)
Monocytes Absolute: 0.4 10*3/uL (ref 0.1–1.0)
Monocytes Relative: 7 %
Neutro Abs: 3.7 10*3/uL (ref 1.7–7.7)
Neutrophils Relative %: 66 %
Platelets: 226 10*3/uL (ref 150–400)
RBC: 5.62 MIL/uL (ref 4.22–5.81)
RDW: 14.3 % (ref 11.5–15.5)
WBC: 5.6 10*3/uL (ref 4.0–10.5)
nRBC: 0 % (ref 0.0–0.2)

## 2019-03-24 MED ORDER — ONDANSETRON 4 MG PO TBDP
4.0000 mg | ORAL_TABLET | Freq: Once | ORAL | Status: AC
  Administered 2019-03-24: 4 mg via ORAL
  Filled 2019-03-24: qty 1

## 2019-03-24 MED ORDER — ONDANSETRON HCL 4 MG PO TABS: 4.0000 mg | ORAL_TABLET | Freq: Three times a day (TID) | ORAL | 0 refills | Status: DC | PRN
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
## 2019-03-24 NOTE — ED Notes (Signed)
Patient verbalizes understanding of discharge instructions. Opportunity for questioning and answers were provided. Armband removed by staff, pt discharged from ED ambulatory.   

## 2019-03-24 NOTE — Discharge Instructions (Signed)
Take zofran as needed for nausea.  Stay hydrated.  Follow up with your doctor as needed.
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
## 2019-03-26 ENCOUNTER — Encounter: Payer: Self-pay | Admitting: Family Medicine

## 2019-03-26 ENCOUNTER — Ambulatory Visit (INDEPENDENT_AMBULATORY_CARE_PROVIDER_SITE_OTHER): Payer: Managed Care, Other (non HMO) | Admitting: Family Medicine

## 2019-03-26 ENCOUNTER — Other Ambulatory Visit: Payer: Self-pay

## 2019-03-26 VITALS — BP 136/88 | HR 75 | Temp 98.0°F | Ht 71.0 in | Wt 265.5 lb

## 2019-03-26 DIAGNOSIS — I10 Essential (primary) hypertension: Secondary | ICD-10-CM

## 2019-03-26 DIAGNOSIS — E860 Dehydration: Secondary | ICD-10-CM | POA: Diagnosis not present

## 2019-03-26 DIAGNOSIS — R0602 Shortness of breath: Secondary | ICD-10-CM

## 2019-03-26 DIAGNOSIS — Z09 Encounter for follow-up examination after completed treatment for conditions other than malignant neoplasm: Secondary | ICD-10-CM | POA: Diagnosis not present

## 2019-03-26 LAB — POCT URINALYSIS DIPSTICK
Bilirubin, UA: NEGATIVE
Blood, UA: NEGATIVE
Glucose, UA: NEGATIVE
Ketones, UA: NEGATIVE
Leukocytes, UA: NEGATIVE
Nitrite, UA: NEGATIVE
Protein, UA: NEGATIVE
Spec Grav, UA: 1.02 (ref 1.010–1.025)
Urobilinogen, UA: 1 E.U./dL
pH, UA: 7 (ref 5.0–8.0)

## 2019-03-26 NOTE — Patient Instructions (Signed)
Dehydration, Adult  Dehydration is when there is not enough fluid or water in your body. This happens when you lose more fluids than you take in. Dehydration can range from mild to very bad. It should be treated right away to keep it from getting very bad. Symptoms of mild dehydration may include:  Thirst.  Dry lips.  Slightly dry mouth.  Dry, warm skin.  Dizziness. Symptoms of moderate dehydration may include:  Very dry mouth.  Muscle cramps.  Dark pee (urine). Pee may be the color of tea.  Your body making less pee.  Your eyes making fewer tears.  Heartbeat that is uneven or faster than normal (palpitations).  Headache.  Light-headedness, especially when you stand up from sitting.  Fainting (syncope). Symptoms of very bad dehydration may include:  Changes in skin, such as: ? Cold and clammy skin. ? Blotchy (mottled) or pale skin. ? Skin that does not quickly return to normal after being lightly pinched and let go (poor skin turgor).  Changes in body fluids, such as: ? Feeling very thirsty. ? Your eyes making fewer tears. ? Not sweating when body temperature is high, such as in hot weather. ? Your body making very little pee.  Changes in vital signs, such as: ? Weak pulse. ? Pulse that is more than 100 beats a minute when you are sitting still. ? Fast breathing. ? Low blood pressure.  Other changes, such as: ? Sunken eyes. ? Cold hands and feet. ? Confusion. ? Lack of energy (lethargy). ? Trouble waking up from sleep. ? Short-term weight loss. ? Unconsciousness. Follow these instructions at home:   If told by your doctor, drink an ORS: ? Make an ORS by using instructions on the package. ? Start by drinking small amounts, about  cup (120 mL) every 5-10 minutes. ? Slowly drink more until you have had the amount that your doctor said to have.  Drink enough clear fluid to keep your pee clear or pale yellow. If you were told to drink an ORS, finish the  ORS first, then start slowly drinking clear fluids. Drink fluids such as: ? Water. Do not drink only water by itself. Doing that can make the salt (sodium) level in your body get too low (hyponatremia). ? Ice chips. ? Fruit juice that you have added water to (diluted). ? Low-calorie sports drinks.  Avoid: ? Alcohol. ? Drinks that have a lot of sugar. These include high-calorie sports drinks, fruit juice that does not have water added, and soda. ? Caffeine. ? Foods that are greasy or have a lot of fat or sugar.  Take over-the-counter and prescription medicines only as told by your doctor.  Do not take salt tablets. Doing that can make the salt level in your body get too high (hypernatremia).  Eat foods that have minerals (electrolytes). Examples include bananas, oranges, potatoes, tomatoes, and spinach.  Keep all follow-up visits as told by your doctor. This is important. Contact a doctor if:  You have belly (abdominal) pain that: ? Gets worse. ? Stays in one area (localizes).  You have a rash.  You have a stiff neck.  You get angry or annoyed more easily than normal (irritability).  You are more sleepy than normal.  You have a harder time waking up than normal.  You feel: ? Weak. ? Dizzy. ? Very thirsty.  You have peed (urinated) only a small amount of very dark pee during 6-8 hours. Get help right away if:  You have   symptoms of very bad dehydration.  You cannot drink fluids without throwing up (vomiting).  Your symptoms get worse with treatment.  You have a fever.  You have a very bad headache.  You are throwing up or having watery poop (diarrhea) and it: ? Gets worse. ? Does not go away.  You have blood or something green (bile) in your throw-up.  You have blood in your poop (stool). This may cause poop to look black and tarry.  You have not peed in 6-8 hours.  You pass out (faint).  Your heart rate when you are sitting still is more than 100 beats a  minute.  You have trouble breathing. This information is not intended to replace advice given to you by your health care provider. Make sure you discuss any questions you have with your health care provider. Document Released: 05/11/2009 Document Revised: 06/27/2017 Document Reviewed: 09/08/2015 Elsevier Patient Education  2020 [employer]  

## 2019-03-26 NOTE — Progress Notes (Signed)
Patient [facility] Follow Up  Subjective:  Patient ID: Jacob Freeman, male    DOB: [birth year]  Age: 48 y.o. MRN: [id number]  CC:  Chief Complaint  Patient presents with  . numbness    numbness in fringer and stomache pain started pain     HPI Jacob Freeman is a 48 year old male who presents for Hospital Follow Up today.   Past Medical History:  Diagnosis Date  . Chest pain, unspecified   . Esophageal reflux   . Hematuria   . Hypercholesteremia   . Hypertension   . Nephrolithiasis   . Pain in joint, hand   . Vitamin D deficiency 01/2019   Current Status: Since his last office visit, he has had a ED visit for Shortness of Breath on 03/24/2019. He states that he believes that he was dehydrated at that time. He is doing well with no complaints. He denies visual changes, chest pain, cough, shortness of breath, heart palpitations, and falls. He has occasional headaches, which he takes Acetaminophen for relief. He does have dizziness with position changes. Denies severe headaches, confusion, seizures, double vision, and blurred vision, nausea and vomiting.  He denies fevers, chills, fatigue, recent infections, weight loss, and night sweats.  No reports of GI problems such as diarrhea, and constipation. He has no reports of blood in stools, dysuria and hematuria. No depression or anxiety, and denies suicidal ideations, homicidal ideations, or auditory hallucinations. He reports generalized pain today.   Past Surgical History:  Procedure Laterality Date  . TONSILLECTOMY      Family History  Problem Relation Age of Onset  . Brain cancer Father   . Hypertension Mother     Social History   Socioeconomic History  . Marital status: Married    Spouse name: Not on file  . Number of children: Not on file  . Years of education: Not on file  . Highest education level: Not on file  Occupational History  . Not on file  Social Needs  .  Financial resource strain: Not on file  . Food insecurity    Worry: Not on file    Inability: Not on file  . Transportation needs    Medical: Not on file    Non-medical: Not on file  Tobacco Use  . Smoking status: Never Smoker  . Smokeless tobacco: Never Used  Substance and Sexual Activity  . Alcohol use: No  . Drug use: No  . Sexual activity: Not on file  Lifestyle  . Physical activity    Days per week: Not on file    Minutes per session: Not on file  . Stress: Not on file  Relationships  . Social Herbalist on phone: Not on file    Gets together: Not on file    Attends religious service: Not on file    Active member of club or organization: Not on file    Attends meetings of clubs or organizations: Not on file    Relationship status: Not on file  . Intimate partner violence    Fear of current or ex partner: Not on file    Emotionally abused: Not on file    Physically abused: Not on file    Forced sexual activity: Not on file  Other Topics Concern  . Not on file  Social History Narrative  . Not on file    Outpatient Medications Prior to Visit  Medication Sig  Dispense Refill  . acetaminophen (TYLENOL) 500 MG tablet Take 500 mg by mouth every 6 (six) hours as needed for mild pain.    Marland Kitchen amLODipine (NORVASC) 10 MG tablet Take 1 tablet by mouth once daily 30 tablet 0  . ibuprofen (ADVIL,MOTRIN) 200 MG tablet Take 800 mg by mouth every 6 (six) hours as needed for mild pain.     . rosuvastatin (CRESTOR) 10 MG tablet Take 1 tablet (10 mg total) by mouth daily. 30 tablet 3  . Vitamin D, Ergocalciferol, (DRISDOL) 1.25 MG (50000 UT) CAPS capsule Take 1 capsule (50,000 Units total) by mouth every 7 (seven) days. 5 capsule 3  . docusate sodium (COLACE) 50 MG capsule Take 1 capsule (50 mg total) by mouth 2 (two) times daily. (Patient not taking: Reported on 03/26/2019) 30 capsule 3  . ondansetron (ZOFRAN) 4 MG tablet Take 1 tablet (4 mg total) by mouth every 8 (eight) hours  as needed for nausea or vomiting. (Patient not taking: Reported on 03/26/2019) 12 tablet 0  . polyethylene glycol powder (GLYCOLAX/MIRALAX) 17 GM/SCOOP powder Take 17 g by mouth 2 (two) times daily as needed. (Patient not taking: Reported on 03/26/2019) 3350 g 1   No facility-administered medications prior to visit.     No Known Allergies  ROS Review of Systems  Constitutional: Negative.   HENT: Negative.   Eyes: Negative.   Respiratory: Negative.   Cardiovascular: Negative.   Gastrointestinal: Negative.   Endocrine: Negative.   Genitourinary: Negative.   Musculoskeletal: Positive for arthralgias (generalized).  Skin: Negative.   Neurological: Positive for dizziness (occasional ) and headaches (occasional ).  Hematological: Negative.   Psychiatric/Behavioral: Negative.       Objective:    Physical Exam  Constitutional: He is oriented to person, place, and time. He appears well-developed and well-nourished.  HENT:  Head: Normocephalic and atraumatic.  Eyes: Conjunctivae are normal.  Neck: Normal range of motion. Neck supple.  Cardiovascular: Normal rate, regular rhythm, normal heart sounds and intact distal pulses.  Pulmonary/Chest: Effort normal and breath sounds normal.  Abdominal: Soft. Bowel sounds are normal.  Musculoskeletal: Normal range of motion.  Neurological: He is alert and oriented to person, place, and time. He has normal reflexes.  Skin: Skin is warm and dry.  Psychiatric: He has a normal mood and affect. His behavior is normal. Thought content normal.  Nursing note and vitals reviewed.   BP 136/88 (BP Location: Right Arm, Patient Position: Sitting, Cuff Size: Normal)   Pulse 75   Temp 98 F (36.7 C) (Oral)   Ht 5\' 11"  (1.803 m)   Wt 265 lb 8 oz (120.4 kg)   BMI 37.03 kg/m  Wt Readings from Last 3 Encounters:  03/26/19 265 lb 8 oz (120.4 kg)  02/05/19 273 lb (123.8 kg)  09/23/18 268 lb (121.6 kg)     Health Maintenance Due  Topic Date Due  .  INFLUENZA VACCINE  02/27/2019    There are no preventive care reminders to display for this patient.  Lab Results  Component Value Date   TSH 2.190 02/05/2019   Lab Results  Component Value Date   WBC 5.6 03/24/2019   HGB 14.6 03/24/2019   HCT 46.1 03/24/2019   MCV 82.0 03/24/2019   PLT 226 03/24/2019   Lab Results  Component Value Date   NA 140 03/24/2019   K 4.2 03/24/2019   CO2 26 03/24/2019   GLUCOSE 102 (H) 03/24/2019   BUN 5 (L) 03/24/2019   CREATININE  1.27 (H) 03/24/2019   BILITOT 0.6 03/24/2019   ALKPHOS 98 03/24/2019   AST 21 03/24/2019   ALT 23 03/24/2019   PROT 7.4 03/24/2019   ALBUMIN 4.0 03/24/2019   CALCIUM 9.3 03/24/2019   ANIONGAP 9 03/24/2019   Lab Results  Component Value Date   CHOL 213 (H) 02/05/2019   Lab Results  Component Value Date   HDL 52 02/05/2019   Lab Results  Component Value Date   LDLCALC 150 (H) 02/05/2019   Lab Results  Component Value Date   TRIG 55 02/05/2019   Lab Results  Component Value Date   CHOLHDL 4.1 02/05/2019   Lab Results  Component Value Date   HGBA1C 5.6 08/26/2018      Assessment & Plan:   1. Hospital discharge follow-up  2. Shortness of breath Stable.   3. Dehydration Urinalysis reveals mild dehydration. He will drink at least 64 oz of fluids daily, avoid extreme heat, and seek cool environments.  - Urinalysis Dipstick  4. Essential hypertension The current medical regimen is effective; blood pressure is stable at 136/88 today; continue present plan and medications as prescribed. He will continue to decrease high sodium intake, excessive alcohol intake, increase potassium intake, smoking cessation, and increase physical activity of at least 30 minutes of cardio activity daily.Hwill continue to follow Heart Healthy or DASH diet.  5. Follow up He will keep previously scheduled follow up appointment.   No orders of the defined types were placed in this encounter.   Orders Placed This  Encounter  Procedures  . Urinalysis Dipstick    Referral Orders  No referral(s) requested today    Raliegh IpNatalie Brandyce Dimario,  MSN, FNP-BC [facility] [address]  [place], [place] [id number] [phone] fax  Problem List Items Addressed This Visit      Cardiovascular and Mediastinum   Essential hypertension    Other Visit Diagnoses    Hospital discharge follow-up    -  Primary   Shortness of breath       Dehydration       Relevant Orders   Urinalysis Dipstick   Follow up          No orders of the defined types were placed in this encounter.   Follow-up: No follow-ups on file.    Kallie LocksNatalie M Ryen Rhames, FNP

## 2019-03-27 DIAGNOSIS — R0602 Shortness of breath: Secondary | ICD-10-CM | POA: Insufficient documentation

## 2019-03-27 DIAGNOSIS — E860 Dehydration: Secondary | ICD-10-CM | POA: Insufficient documentation

## 2019-04-12 ENCOUNTER — Other Ambulatory Visit: Payer: Self-pay | Admitting: Family Medicine

## 2019-05-10 ENCOUNTER — Other Ambulatory Visit: Payer: Self-pay | Admitting: Family Medicine

## 2019-06-10 ENCOUNTER — Other Ambulatory Visit: Payer: Self-pay | Admitting: Family Medicine

## 2019-07-16 ENCOUNTER — Other Ambulatory Visit: Payer: Self-pay | Admitting: Family Medicine

## 2019-07-16 DIAGNOSIS — E785 Hyperlipidemia, unspecified: Secondary | ICD-10-CM

## 2019-07-30 DIAGNOSIS — R7303 Prediabetes: Secondary | ICD-10-CM

## 2019-07-30 HISTORY — DX: Prediabetes: R73.03

## 2019-08-09 ENCOUNTER — Ambulatory Visit: Payer: Self-pay | Admitting: Family Medicine

## 2019-08-24 ENCOUNTER — Encounter: Payer: Self-pay | Admitting: Family Medicine

## 2019-08-24 ENCOUNTER — Other Ambulatory Visit: Payer: Self-pay

## 2019-08-24 ENCOUNTER — Ambulatory Visit (INDEPENDENT_AMBULATORY_CARE_PROVIDER_SITE_OTHER): Payer: 59 | Admitting: Family Medicine

## 2019-08-24 VITALS — BP 134/77 | HR 76 | Temp 98.3°F | Ht 71.0 in | Wt 270.6 lb

## 2019-08-24 DIAGNOSIS — E785 Hyperlipidemia, unspecified: Secondary | ICD-10-CM

## 2019-08-24 DIAGNOSIS — Z09 Encounter for follow-up examination after completed treatment for conditions other than malignant neoplasm: Secondary | ICD-10-CM

## 2019-08-24 DIAGNOSIS — Z0000 Encounter for general adult medical examination without abnormal findings: Secondary | ICD-10-CM | POA: Diagnosis not present

## 2019-08-24 DIAGNOSIS — R8290 Unspecified abnormal findings in urine: Secondary | ICD-10-CM | POA: Diagnosis not present

## 2019-08-24 DIAGNOSIS — R7303 Prediabetes: Secondary | ICD-10-CM | POA: Insufficient documentation

## 2019-08-24 DIAGNOSIS — I10 Essential (primary) hypertension: Secondary | ICD-10-CM | POA: Diagnosis not present

## 2019-08-24 LAB — POCT URINALYSIS DIPSTICK
Bilirubin, UA: NEGATIVE
Glucose, UA: NEGATIVE
Ketones, UA: NEGATIVE
Leukocytes, UA: NEGATIVE
Nitrite, UA: NEGATIVE
Protein, UA: NEGATIVE
Spec Grav, UA: 1.02 (ref 1.010–1.025)
Urobilinogen, UA: 1 E.U./dL
pH, UA: 6.5 (ref 5.0–8.0)

## 2019-08-24 LAB — POCT GLYCOSYLATED HEMOGLOBIN (HGB A1C): Hemoglobin A1C: 6.1 % — AB (ref 4.0–5.6)

## 2019-08-24 LAB — GLUCOSE, POCT (MANUAL RESULT ENTRY): POC Glucose: 102 mg/dl — AB (ref 70–99)

## 2019-08-24 MED ORDER — ROSUVASTATIN CALCIUM 10 MG PO TABS: 10.0000 mg | ORAL_TABLET | Freq: Every day | ORAL | 6 refills | Status: DC

## 2019-08-24 MED ORDER — AMLODIPINE BESYLATE 10 MG PO TABS: 10.0000 mg | ORAL_TABLET | Freq: Every day | ORAL | 6 refills | Status: DC

## 2019-08-24 NOTE — Progress Notes (Signed)
[facility]   Established Patient Office Visit  Subjective:  Patient ID: Jacob Freeman, male    DOB: 02-04-1971  Age: 49 y.o. MRN: [id number]  CC:  Chief Complaint  Patient presents with  . Follow-up    HTN    HPI Jacob Freeman is a 49 year old male who presents for Follow up today.   Past Medical History:  Diagnosis Date  . Chest pain, unspecified   . Esophageal reflux   . Hematuria   . Hypercholesteremia   . Hypertension   . Nephrolithiasis   . Pain in joint, hand   . Vitamin D deficiency 01/2019   Current Status: Since his last office visit, he is doing well with no complaints. He denies visual changes, chest pain, cough, shortness of breath, heart palpitations, and falls. He has occasional headaches and dizziness with position changes. Denies severe headaches, confusion, seizures, double vision, and blurred vision, nausea and vomiting. He denies fevers, chills, fatigue, recent infections, weight loss, and night sweats. He denies suicidal ideations, homicidal ideations, or auditory hallucinations. He denies pain today.   Past Surgical History:  Procedure Laterality Date  . TONSILLECTOMY      Family History  Problem Relation Age of Onset  . Brain cancer Father   . Hypertension Mother     Social History   Socioeconomic History  . Marital status: Married    Spouse name: Not on file  . Number of children: Not on file  . Years of education: Not on file  . Highest education level: Not on file  Occupational History  . Not on file  Tobacco Use  . Smoking status: Never Smoker  . Smokeless tobacco: Never Used  Substance and Sexual Activity  . Alcohol use: No  . Drug use: No  . Sexual activity: Yes  Other Topics Concern  . Not on file  Social History Narrative  . Not on file   Social Determinants of Health   Financial Resource Strain:   . Difficulty of Paying Living Expenses: Not on file  Food Insecurity:   .  Worried About Programme researcher, broadcasting/film/video in the Last Year: Not on file  . Ran Out of Food in the Last Year: Not on file  Transportation Needs:   . Lack of Transportation (Medical): Not on file  . Lack of Transportation (Non-Medical): Not on file  Physical Activity:   . Days of Exercise per Week: Not on file  . Minutes of Exercise per Session: Not on file  Stress:   . Feeling of Stress : Not on file  Social Connections:   . Frequency of Communication with Friends and Family: Not on file  . Frequency of Social Gatherings with Friends and Family: Not on file  . Attends Religious Services: Not on file  . Active Member of Clubs or Organizations: Not on file  . Attends Banker Meetings: Not on file  . Marital Status: Not on file  Intimate Partner Violence:   . Fear of Current or Ex-Partner: Not on file  . Emotionally Abused: Not on file  . Physically Abused: Not on file  . Sexually Abused: Not on file    Outpatient Medications Prior to Visit  Medication Sig Dispense Refill  . acetaminophen (TYLENOL) 500 MG tablet Take 500 mg by mouth every 6 (six) hours as needed for mild pain.    Marland Kitchen ibuprofen (ADVIL,MOTRIN) 200 MG tablet Take 800 mg by mouth every 6 (  six) hours as needed for mild pain.     . Vitamin D, Ergocalciferol, (DRISDOL) 1.25 MG (50000 UT) CAPS capsule Take 1 capsule (50,000 Units total) by mouth every 7 (seven) days. (Patient not taking: Reported on 08/24/2019) 5 capsule 3  . amLODipine (NORVASC) 10 MG tablet Take 1 tablet by mouth once daily 30 tablet 0  . docusate sodium (COLACE) 50 MG capsule Take 1 capsule (50 mg total) by mouth 2 (two) times daily. (Patient not taking: Reported on 03/26/2019) 30 capsule 3  . ondansetron (ZOFRAN) 4 MG tablet Take 1 tablet (4 mg total) by mouth every 8 (eight) hours as needed for nausea or vomiting. (Patient not taking: Reported on 03/26/2019) 12 tablet 0  . polyethylene glycol powder (GLYCOLAX/MIRALAX) 17 GM/SCOOP powder Take 17 g by mouth 2  (two) times daily as needed. (Patient not taking: Reported on 03/26/2019) 3350 g 1  . rosuvastatin (CRESTOR) 10 MG tablet Take 1 tablet by mouth once daily 30 tablet 0   No facility-administered medications prior to visit.    No Known Allergies  ROS Review of Systems  Constitutional: Negative.   HENT: Negative.   Eyes: Negative.   Respiratory: Negative.   Cardiovascular: Negative.   Gastrointestinal: Negative.   Endocrine: Negative.   Genitourinary: Negative.   Musculoskeletal: Negative.   Skin: Negative.   Allergic/Immunologic: Negative.   Neurological: Positive for dizziness (occasional ) and headaches (occasional ).  Hematological: Negative.   Psychiatric/Behavioral: Negative.       Objective:    Physical Exam  Constitutional: He is oriented to person, place, and time. He appears well-developed and well-nourished.  HENT:  Head: Normocephalic and atraumatic.  Eyes: Conjunctivae are normal.  Cardiovascular: Normal rate, regular rhythm, normal heart sounds and intact distal pulses.  Pulmonary/Chest: Effort normal and breath sounds normal.  Abdominal: Soft. Bowel sounds are normal.  Musculoskeletal:        General: Normal range of motion.     Cervical back: Normal range of motion and neck supple.  Neurological: He is alert and oriented to person, place, and time. He has normal reflexes.  Skin: Skin is warm and dry.  Psychiatric: He has a normal mood and affect. His behavior is normal. Judgment and thought content normal.  Nursing note and vitals reviewed.   BP 134/77   Pulse 76   Temp 98.3 F (36.8 C) (Oral)   Ht 5\' 11"  (1.803 m)   Wt 270 lb 9.6 oz (122.7 kg)   SpO2 98%   BMI 37.74 kg/m  Wt Readings from Last 3 Encounters:  08/24/19 270 lb 9.6 oz (122.7 kg)  03/26/19 265 lb 8 oz (120.4 kg)  02/05/19 273 lb (123.8 kg)     Health Maintenance Due  Topic Date Due  . INFLUENZA VACCINE  02/27/2019    There are no preventive care reminders to display for this  patient.  Lab Results  Component Value Date   TSH 2.190 02/05/2019   Lab Results  Component Value Date   WBC 5.6 03/24/2019   HGB 14.6 03/24/2019   HCT 46.1 03/24/2019   MCV 82.0 03/24/2019   PLT 226 03/24/2019   Lab Results  Component Value Date   NA 140 03/24/2019   K 4.2 03/24/2019   CO2 26 03/24/2019   GLUCOSE 102 (H) 03/24/2019   BUN 5 (L) 03/24/2019   CREATININE 1.27 (H) 03/24/2019   BILITOT 0.6 03/24/2019   ALKPHOS 98 03/24/2019   AST 21 03/24/2019   ALT 23 03/24/2019  PROT 7.4 03/24/2019   ALBUMIN 4.0 03/24/2019   CALCIUM 9.3 03/24/2019   ANIONGAP 9 03/24/2019   Lab Results  Component Value Date   CHOL 213 (H) 02/05/2019   Lab Results  Component Value Date   HDL 52 02/05/2019   Lab Results  Component Value Date   LDLCALC 150 (H) 02/05/2019   Lab Results  Component Value Date   TRIG 55 02/05/2019   Lab Results  Component Value Date   CHOLHDL 4.1 02/05/2019   Lab Results  Component Value Date   HGBA1C 6.1 (A) 08/24/2019      Assessment & Plan:   1. Prediabetes Hgb A1c stable at 6.1 today. He will decrease foods/beverages high in sugars and carbs and follow Heart Healthy or DASH diet. Increase physical activity to at least 30 minutes cardio exercise daily.   2. Hyperlipidemia, unspecified hyperlipidemia type - rosuvastatin (CRESTOR) 10 MG tablet; Take 1 tablet (10 mg total) by mouth daily.  Dispense: 30 tablet; Refill: 6  3. Essential hypertension The current medical regimen is effective; blood pressure is stable at 134/77 today; continue present plan and medications as prescribed. He will continue to take medications as prescribed, to decrease high sodium intake, excessive alcohol intake, increase potassium intake, smoking cessation, and increase physical activity of at least 30 minutes of cardio activity daily. He will continue to follow Heart Healthy or DASH diet. - amLODipine (NORVASC) 10 MG tablet; Take 1 tablet (10 mg total) by mouth  daily.  Dispense: 30 tablet; Refill: 6  4. Abnormal urinalysis Results are pending.  - Urine Culture  5. Healthcare maintenance - POCT urinalysis dipstick - POCT glycosylated hemoglobin (Hb A1C) - POCT glucose (manual entry)  6. Follow up He will follow up in 6 months.  Meds ordered this encounter  Medications  . amLODipine (NORVASC) 10 MG tablet    Sig: Take 1 tablet (10 mg total) by mouth daily.    Dispense:  30 tablet    Refill:  6  . rosuvastatin (CRESTOR) 10 MG tablet    Sig: Take 1 tablet (10 mg total) by mouth daily.    Dispense:  30 tablet    Refill:  6    Orders Placed This Encounter  Procedures  . Urine Culture  . POCT urinalysis dipstick  . POCT glycosylated hemoglobin (Hb A1C)  . POCT glucose (manual entry)    Referral Orders  No referral(s) requested today    Raliegh Ip,  MSN, FNP-BC [facility] [address] [place], [place] [address] [phone]- fax    Problem List Items Addressed This Visit      Cardiovascular and Mediastinum   Essential hypertension   Relevant Medications   amLODipine (NORVASC) 10 MG tablet   rosuvastatin (CRESTOR) 10 MG tablet    Other Visit Diagnoses    Prediabetes    -  Primary   Hyperlipidemia, unspecified hyperlipidemia type       Relevant Medications   amLODipine (NORVASC) 10 MG tablet   rosuvastatin (CRESTOR) 10 MG tablet   Abnormal urinalysis       Relevant Orders   Urine Culture   Healthcare maintenance       Relevant Orders   POCT urinalysis dipstick (Completed)   POCT glycosylated hemoglobin (Hb A1C) (Completed)   POCT glucose (manual entry) (Completed)   Follow up          Meds ordered this encounter  Medications  .  amLODipine (NORVASC) 10 MG tablet    Sig: Take 1 tablet (10 mg total) by mouth daily.    Dispense:  30 tablet    Refill:  6  . rosuvastatin (CRESTOR) 10 MG tablet    Sig: Take 1 tablet (10 mg  total) by mouth daily.    Dispense:  30 tablet    Refill:  6    Follow-up: Return in about 6 months (around 02/21/2020).    Azzie Glatter, FNP

## 2019-08-26 LAB — URINE CULTURE: Organism ID, Bacteria: NO GROWTH

## 2019-11-05 ENCOUNTER — Other Ambulatory Visit: Payer: Self-pay

## 2019-11-05 ENCOUNTER — Ambulatory Visit (INDEPENDENT_AMBULATORY_CARE_PROVIDER_SITE_OTHER): Payer: 59 | Admitting: Family Medicine

## 2019-11-05 DIAGNOSIS — I10 Essential (primary) hypertension: Secondary | ICD-10-CM

## 2019-11-05 NOTE — Progress Notes (Signed)
Jacob Freeman arrived for his nurse visit blood pressure check only. Patient waited in the lobby for 45 mins. Bp reading today was 131/80  Hr 80. He reported taking his blood pressure medication around 7:30 am.

## 2019-11-15 IMAGING — CT CT RENAL STONE PROTOCOL
2 of 4 series · 16 of 46 positions shown, 18 images · non-contrast
Comparison: August 27, 2016

CLINICAL DATA: Left-sided abdominal region/flank pain

EXAM:
CT ABDOMEN AND PELVIS WITHOUT CONTRAST
TECHNIQUE: Multidetector CT imaging of the abdomen and pelvis was performed
following the standard protocol without oral or IV contrast.

[Series 3: renal stone 5.0 · axial · 0.78mm/px · z∈[-644,-209]mm · 13 of 99 slices shown, 15 images]
[im 8/99  soft-tissue]
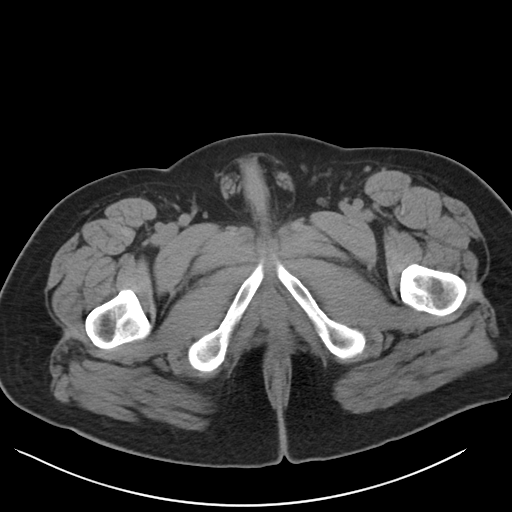
[im 8/99  bone]
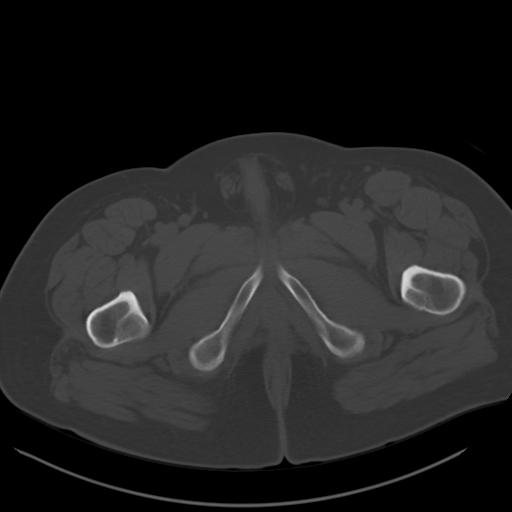
[im 15/99  soft-tissue]
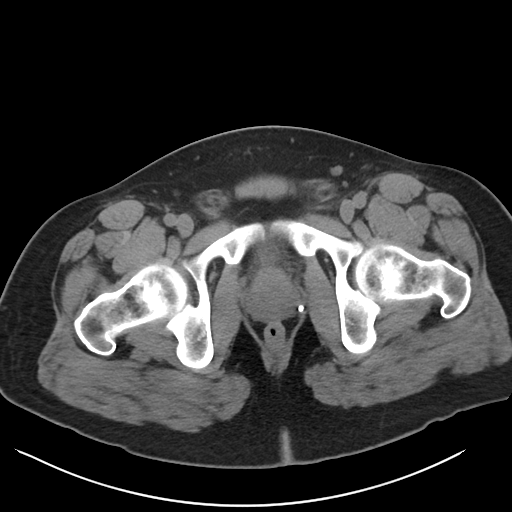
[im 22/99  soft-tissue]
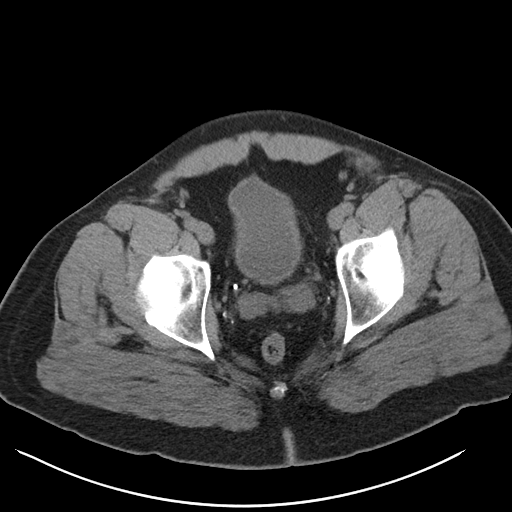
[im 30/99  soft-tissue]
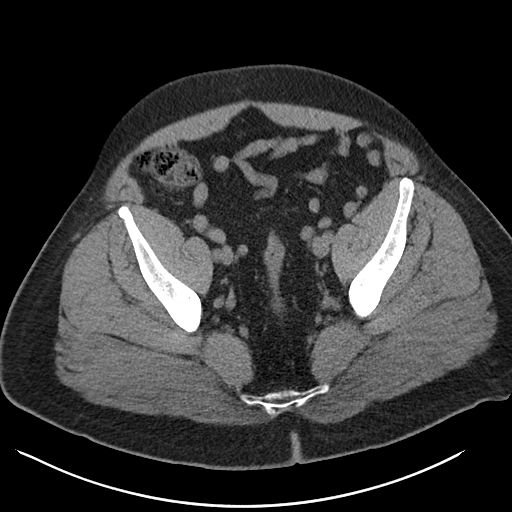
[im 37/99  soft-tissue]
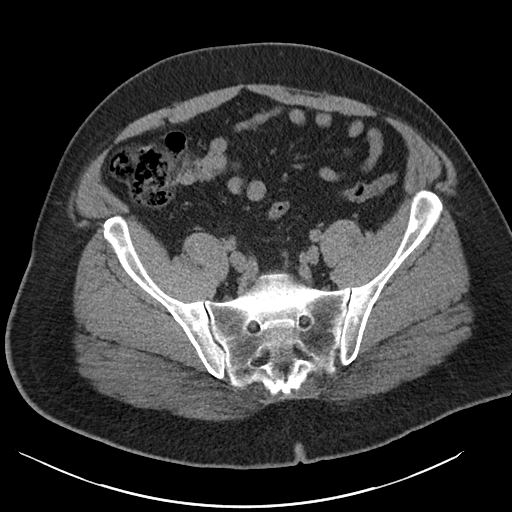
[im 44/99  soft-tissue]
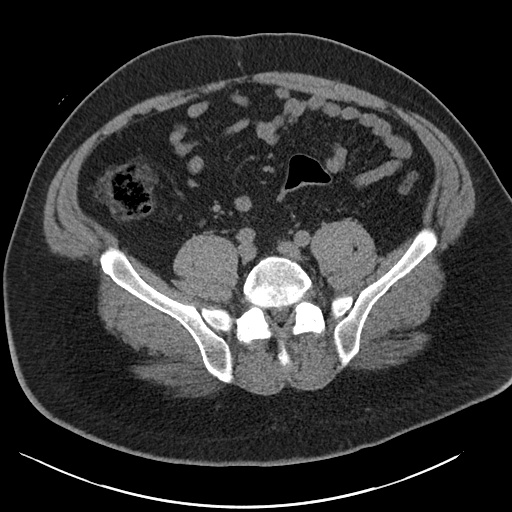
[im 51/99  soft-tissue]
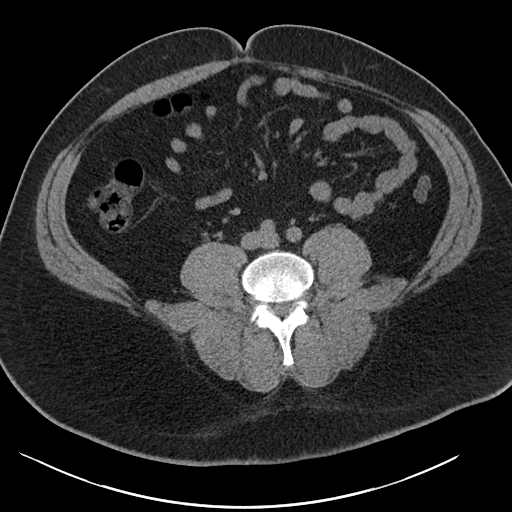
[im 59/99  soft-tissue]
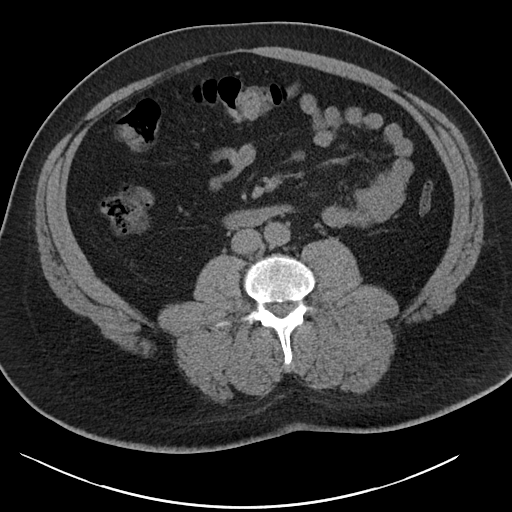
[im 66/99  soft-tissue]
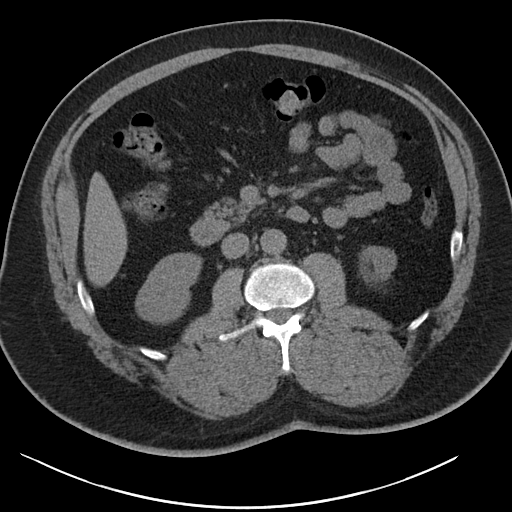
[im 66/99  bone]
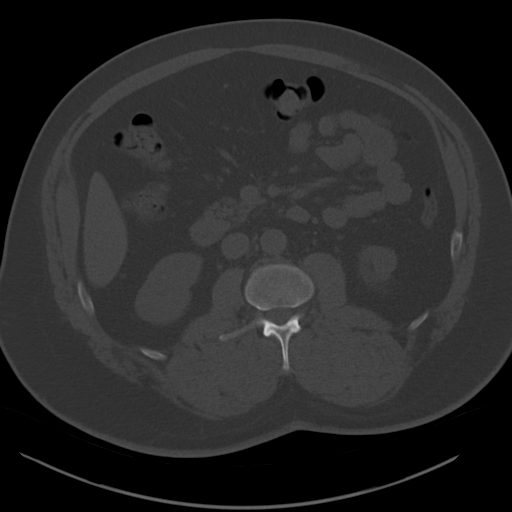
[im 73/99  soft-tissue]
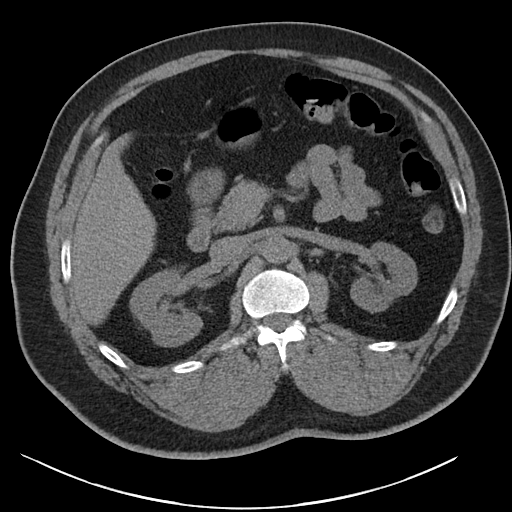
[im 80/99  soft-tissue]
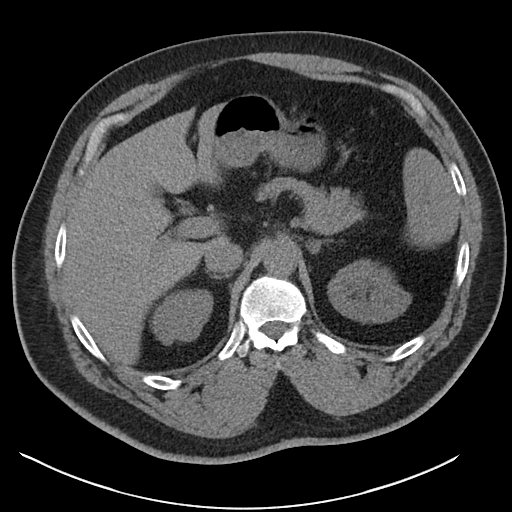
[im 88/99  soft-tissue]
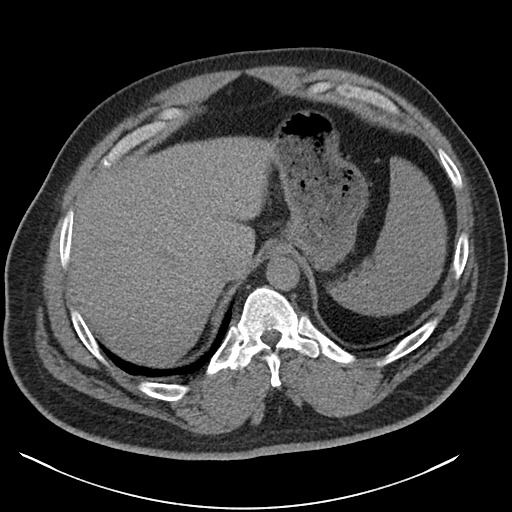
[im 95/99  soft-tissue]
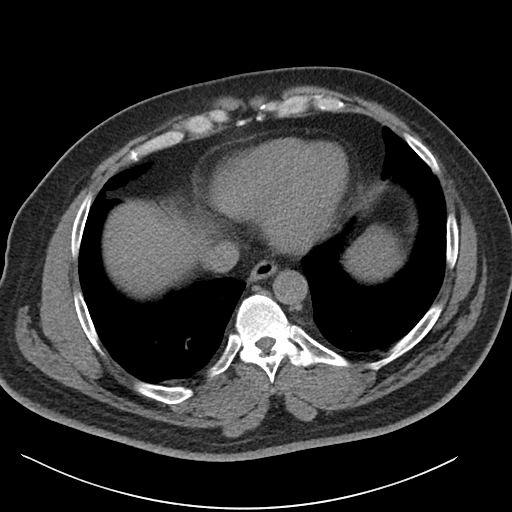

[Series 6: renal stone 3.0 cor · coronal · 0.91mm/px · 3 of 118 slices shown]
[im 40/118  soft-tissue]
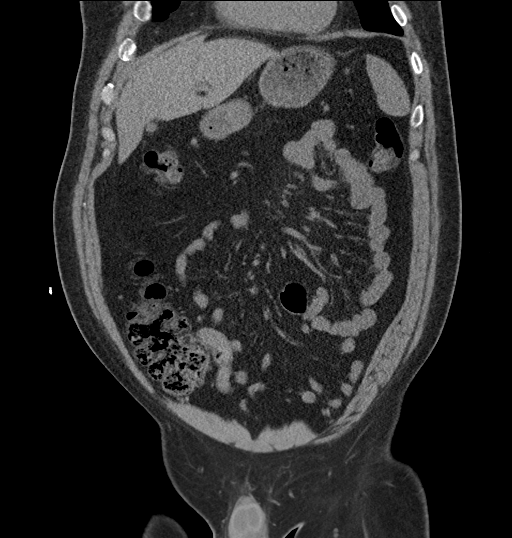
[im 53/118  soft-tissue]
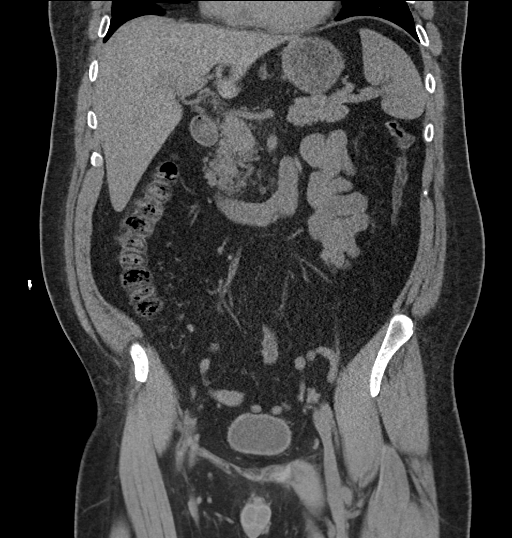
[im 66/118  soft-tissue]
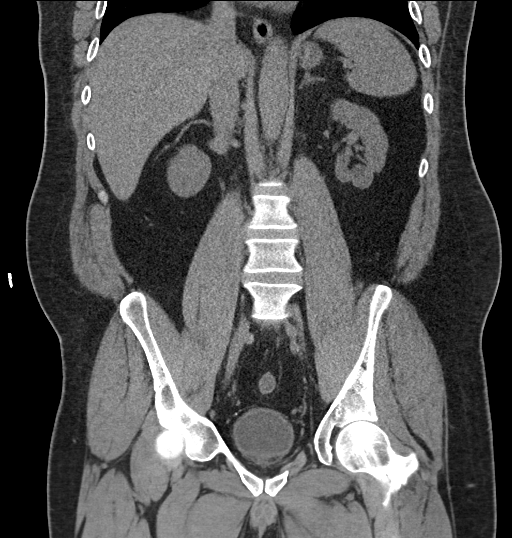

[16 of 46 positions shown; findings below may reference images not displayed]

FINDINGS: Lower chest: There is scarring in the anterior right lung base
region. Visualized lung bases otherwise are clear.

Hepatobiliary: No focal liver lesions are appreciable. Gallbladder
wall is not appreciably thickened. There is no biliary duct
dilatation.

Pancreas: No pancreatic mass or inflammatory focus.

Spleen: No splenic lesions are evident.

Adrenals/Urinary Tract: Adrenals bilaterally appear normal. There is
mild scarring in each kidney. There is no evident renal mass or
hydronephrosis on either side. There is no appreciable renal or
ureteral calculus on either side. Urinary bladder is midline with
wall thickness within normal limits.

Stomach/Bowel: There is no appreciable bowel wall or mesenteric
thickening. There is no evident bowel obstruction. There is no free
air or portal venous air.

Vascular/Lymphatic: No abdominal aortic aneurysm. No vascular
lesions are appreciable on this noncontrast enhanced study. There is
no appreciable adenopathy or abscess in the abdomen pelvis.

Reproductive: Prostate and seminal vesicles are normal in size and
contour. No evident pelvic mass.

Other: Appendix appears unremarkable. There is no abscess or ascites
in the abdomen or pelvis.

Musculoskeletal: There are no appreciable blastic or lytic bone
lesions. There is no intramuscular or abdominal wall lesion.
IMPRESSION: 1. A cause for patient's symptoms has not been established with this
study.

2. Scarring in each kidney. No hydronephrosis on either side. No
evident renal or ureteral calculus on either side. Urinary bladder
wall thickness is within normal limits.

3. No bowel obstruction. No abscess in the abdomen or pelvis.
Appendix appears normal.

## 2019-11-30 ENCOUNTER — Telehealth: Payer: Self-pay | Admitting: Family Medicine

## 2019-11-30 NOTE — Telephone Encounter (Addendum)
Pt wanted doctor to be aware of mri results that revealed a Lumbar sprain and an Acute sprain of ligament in the neck. Pt unsure if we are able to see results.

## 2019-12-03 ENCOUNTER — Telehealth: Payer: Self-pay

## 2019-12-03 NOTE — Telephone Encounter (Signed)
Done

## 2019-12-17 ENCOUNTER — Telehealth: Payer: Self-pay | Admitting: Family Medicine

## 2019-12-17 NOTE — Telephone Encounter (Signed)
Message left on patient's voicemail today with results of recent scan. He is to contact office if needed.

## 2020-02-16 ENCOUNTER — Ambulatory Visit: Payer: Self-pay | Admitting: Family Medicine

## 2020-02-16 ENCOUNTER — Ambulatory Visit (INDEPENDENT_AMBULATORY_CARE_PROVIDER_SITE_OTHER): Payer: 59 | Admitting: Family Medicine

## 2020-02-16 ENCOUNTER — Other Ambulatory Visit: Payer: Self-pay

## 2020-02-16 DIAGNOSIS — Z09 Encounter for follow-up examination after completed treatment for conditions other than malignant neoplasm: Secondary | ICD-10-CM | POA: Diagnosis not present

## 2020-02-16 DIAGNOSIS — R7303 Prediabetes: Secondary | ICD-10-CM | POA: Diagnosis not present

## 2020-02-16 DIAGNOSIS — I10 Essential (primary) hypertension: Secondary | ICD-10-CM | POA: Diagnosis not present

## 2020-02-16 NOTE — Progress Notes (Signed)
Virtual Visit via Telephone Note  I connected with Jacob Freeman on 02/25/20 at  2:55 PM EDT by telephone and verified that I am speaking with the correct person using two identifiers.   I discussed the limitations, risks, security and privacy concerns of performing an evaluation and management service by telephone and the availability of in person appointments. I also discussed with the patient that there may be a patient responsible charge related to this service. The patient expressed understanding and agreed to proceed.   Televisit Today Patient Location: Home Provider Location: Office   History of Present Illness:  Past Surgical History:  Procedure Laterality Date  . TONSILLECTOMY      Social History   Socioeconomic History  . Marital status: Married    Spouse name: Not on file  . Number of children: Not on file  . Years of education: Not on file  . Highest education level: Not on file  Occupational History  . Not on file  Tobacco Use  . Smoking status: Never Smoker  . Smokeless tobacco: Never Used  Vaping Use  . Vaping Use: Never used  Substance and Sexual Activity  . Alcohol use: No  . Drug use: No  . Sexual activity: Yes  Other Topics Concern  . Not on file  Social History Narrative  . Not on file   Social Determinants of Health   Financial Resource Strain:   . Difficulty of Paying Living Expenses:   Food Insecurity:   . Worried About Programme researcher, broadcasting/film/video in the Last Year:   . Barista in the Last Year:   Transportation Needs:   . Freight forwarder (Medical):   Marland Kitchen Lack of Transportation (Non-Medical):   Physical Activity:   . Days of Exercise per Week:   . Minutes of Exercise per Session:   Stress:   . Feeling of Stress :   Social Connections:   . Frequency of Communication with Friends and Family:   . Frequency of Social Gatherings with Friends and Family:   . Attends Religious Services:   . Active Member of Clubs or Organizations:   .  Attends Banker Meetings:   Marland Kitchen Marital Status:   Intimate Partner Violence:   . Fear of Current or Ex-Partner:   . Emotionally Abused:   Marland Kitchen Physically Abused:   . Sexually Abused:     Family History  Problem Relation Age of Onset  . Brain cancer Father   . Hypertension Mother     Past Medical History:  Diagnosis Date  . Chest pain, unspecified   . Esophageal reflux   . Hematuria   . Hypercholesteremia   . Hypertension   . Nephrolithiasis   . Pain in joint, hand   . Prediabetes 07/2019  . Vitamin D deficiency 01/2019    Patient Active Problem List   Diagnosis Date Noted  . Prediabetes 08/24/2019  . Shortness of breath 03/27/2019  . Dehydration 03/27/2019  . Constipation 02/05/2019  . Nausea 02/05/2019  . Strain of flank 09/23/2018  . Muscle strain 09/23/2018  . Flank pain 09/23/2018  . Finger pain, left 09/11/2018  . Skin infection 09/11/2018  . Essential hypertension 08/26/2018  . Class 2 severe obesity with serious comorbidity and body mass index (BMI) of 38.0 to 38.9 in adult [facility]) 08/26/2018    No Known Allergies   Current Outpatient Medications on File Prior to Visit  Medication Sig Dispense Refill  . acetaminophen (TYLENOL) 500 MG tablet  Take 500 mg by mouth every 6 (six) hours as needed for mild pain.    Marland Kitchen amLODipine (NORVASC) 10 MG tablet Take 1 tablet (10 mg total) by mouth daily. 30 tablet 6  . meloxicam (MOBIC) 15 MG tablet Take 1 tablet by mouth daily.    . rosuvastatin (CRESTOR) 10 MG tablet Take 1 tablet (10 mg total) by mouth daily. 30 tablet 6  . tiZANidine (ZANAFLEX) 4 MG tablet Take 1 tablet by mouth every 8 (eight) hours as needed.    . Vitamin D, Ergocalciferol, (DRISDOL) 1.25 MG (50000 UT) CAPS capsule Take 1 capsule (50,000 Units total) by mouth every 7 (seven) days. 5 capsule 3   No current facility-administered medications on file prior to visit.     Current Status: Since his last office visit, he is doing well with no  complaints. He denies visual changes, chest pain, cough, shortness of breath, heart palpitations, and falls. He has occasional headaches and dizziness with position changes. Denies severe headaches, confusion, seizures, double vision, and blurred vision, nausea and vomiting. He denies fevers, chills, fatigue, recent infections, weight loss, and night sweats. Denies GI problems such as diarrhea, and constipation. He has no reports of blood in stools, dysuria and hematuria. No depression or anxiety, and denies suicidal ideations, homicidal ideations, or auditory hallucinations. He is taking all medications as prescribed. He denies pain today.    Observations/Objective:  Telephone Visit   Assessment and Plan:  1. Essential hypertension He will continue to take medications as prescribed, to decrease high sodium intake, excessive alcohol intake, increase potassium intake, smoking cessation, and increase physical activity of at least 30 minutes of cardio activity daily. He will continue to follow Heart Healthy or DASH diet.  2. Prediabetes Stable. He will continue medication as prescribed, to decrease foods/beverages high in sugars and carbs and follow Heart Healthy or DASH diet. Increase physical activity to at least 30 minutes cardio exercise daily.   3. Follow up He will follow up in 6 months.    No orders of the defined types were placed in this encounter.   No orders of the defined types were placed in this encounter.   Referral Orders  No referral(s) requested today    Raliegh Ip,  MSN, FNP-BC [facility]/Internal Medicine/Sickle Cell Center [facility] [address] [place], [place] [address] [phone] fax  I discussed the assessment and treatment plan with the patient. The patient was provided an opportunity to ask questions and all were answered. The patient agreed with the plan and demonstrated an understanding of the  instructions.   The patient was advised to call back or seek an in-person evaluation if the symptoms worsen or if the condition fails to improve as anticipated.  I provided 20 minutes of non-face-to-face time during this encounter.   Kallie Locks, FNP

## 2020-03-01 ENCOUNTER — Other Ambulatory Visit: Payer: Self-pay

## 2020-03-01 ENCOUNTER — Other Ambulatory Visit: Payer: 59

## 2020-03-01 DIAGNOSIS — Z0000 Encounter for general adult medical examination without abnormal findings: Secondary | ICD-10-CM

## 2020-03-02 ENCOUNTER — Encounter: Payer: Self-pay | Admitting: Family Medicine

## 2020-03-02 LAB — CBC WITH DIFFERENTIAL/PLATELET
Basophils Absolute: 0 10*3/uL (ref 0.0–0.2)
Basos: 1 %
EOS (ABSOLUTE): 0.2 10*3/uL (ref 0.0–0.4)
Eos: 6 %
Hematocrit: 43.5 % (ref 37.5–51.0)
Hemoglobin: 14 g/dL (ref 13.0–17.7)
Immature Grans (Abs): 0 10*3/uL (ref 0.0–0.1)
Immature Granulocytes: 0 %
Lymphocytes Absolute: 1.4 10*3/uL (ref 0.7–3.1)
Lymphs: 33 %
MCH: 25.7 pg — ABNORMAL LOW (ref 26.6–33.0)
MCHC: 32.2 g/dL (ref 31.5–35.7)
MCV: 80 fL (ref 79–97)
Monocytes Absolute: 0.4 10*3/uL (ref 0.1–0.9)
Monocytes: 10 %
Neutrophils Absolute: 2.1 10*3/uL (ref 1.4–7.0)
Neutrophils: 50 %
Platelets: 228 10*3/uL (ref 150–450)
RBC: 5.44 x10E6/uL (ref 4.14–5.80)
RDW: 14.2 % (ref 11.6–15.4)
WBC: 4.2 10*3/uL (ref 3.4–10.8)

## 2020-03-02 LAB — LIPID PANEL
Chol/HDL Ratio: 4.5 ratio (ref 0.0–5.0)
Cholesterol, Total: 212 mg/dL — ABNORMAL HIGH (ref 100–199)
HDL: 47 mg/dL (ref >39)
LDL Chol Calc (NIH): 155 mg/dL — ABNORMAL HIGH (ref 0–99)
Triglycerides: 56 mg/dL (ref 0–149)
VLDL Cholesterol Cal: 10 mg/dL (ref 5–40)

## 2020-03-02 LAB — COMPREHENSIVE METABOLIC PANEL
ALT: 24 IU/L (ref 0–44)
AST: 17 IU/L (ref 0–40)
Albumin/Globulin Ratio: 1.9 (ref 1.2–2.2)
Albumin: 4.4 g/dL (ref 4.0–5.0)
Alkaline Phosphatase: 86 IU/L (ref 48–121)
BUN/Creatinine Ratio: 7 — ABNORMAL LOW (ref 9–20)
BUN: 9 mg/dL (ref 6–24)
Bilirubin Total: 0.4 mg/dL (ref 0.0–1.2)
CO2: 25 mmol/L (ref 20–29)
Calcium: 9 mg/dL (ref 8.7–10.2)
Chloride: 106 mmol/L (ref 96–106)
Creatinine, Ser: 1.23 mg/dL (ref 0.76–1.27)
GFR calc Af Amer: 79 mL/min/{1.73_m2} (ref >59)
GFR calc non Af Amer: 69 mL/min/{1.73_m2} (ref >59)
Globulin, Total: 2.3 g/dL (ref 1.5–4.5)
Glucose: 108 mg/dL — ABNORMAL HIGH (ref 65–99)
Potassium: 4 mmol/L (ref 3.5–5.2)
Sodium: 143 mmol/L (ref 134–144)
Total Protein: 6.7 g/dL (ref 6.0–8.5)

## 2020-03-02 LAB — TSH: TSH: 2.51 u[IU]/mL (ref 0.450–4.500)

## 2020-03-02 LAB — VITAMIN D 25 HYDROXY (VIT D DEFICIENCY, FRACTURES): Vit D, 25-Hydroxy: 23.1 ng/mL — ABNORMAL LOW (ref 30.0–100.0)

## 2020-03-02 LAB — VITAMIN B12: Vitamin B-12: 403 pg/mL (ref 232–1245)

## 2020-03-06 NOTE — Progress Notes (Signed)
Pt was called @ 10:23 to discuss his labs. Pt didn't answer the phone. Pt was left a message to gives [place] a called back.

## 2020-03-22 ENCOUNTER — Ambulatory Visit: Payer: Self-pay | Admitting: Family Medicine

## 2020-04-25 ENCOUNTER — Other Ambulatory Visit: Payer: Self-pay | Admitting: Family Medicine

## 2020-04-25 DIAGNOSIS — I10 Essential (primary) hypertension: Secondary | ICD-10-CM

## 2020-06-07 ENCOUNTER — Telehealth: Payer: Self-pay | Admitting: Family Medicine

## 2020-06-07 NOTE — Telephone Encounter (Signed)
Pt was called concerning AWV w/ PCC. lvm to call back °

## 2020-06-28 HISTORY — PX: BACK SURGERY: SHX140

## 2020-08-18 ENCOUNTER — Encounter: Payer: Self-pay | Admitting: Family Medicine

## 2020-08-18 ENCOUNTER — Telehealth (INDEPENDENT_AMBULATORY_CARE_PROVIDER_SITE_OTHER): Payer: 59 | Admitting: Family Medicine

## 2020-08-18 VITALS — Temp 96.4°F | Ht 71.0 in | Wt 270.0 lb

## 2020-08-18 DIAGNOSIS — M549 Dorsalgia, unspecified: Secondary | ICD-10-CM

## 2020-08-18 DIAGNOSIS — M47816 Spondylosis without myelopathy or radiculopathy, lumbar region: Secondary | ICD-10-CM | POA: Diagnosis not present

## 2020-08-18 DIAGNOSIS — I10 Essential (primary) hypertension: Secondary | ICD-10-CM | POA: Diagnosis not present

## 2020-08-18 DIAGNOSIS — Z09 Encounter for follow-up examination after completed treatment for conditions other than malignant neoplasm: Secondary | ICD-10-CM

## 2020-08-18 DIAGNOSIS — G8929 Other chronic pain: Secondary | ICD-10-CM

## 2020-08-18 NOTE — Progress Notes (Signed)
Virtual Visit via Telephone Note  I connected with Jacob Freeman on 08/18/20 at  8:00 AM EST by telephone and verified that I am speaking with the correct person using two identifiers.  Location: Patient: Home Provider: Office   I discussed the limitations, risks, security and privacy concerns of performing an evaluation and management service by telephone and the availability of in person appointments. I also discussed with the patient that there may be a patient responsible charge related to this service. The patient expressed understanding and agreed to proceed.   History of Present Illness:  Patient Active Problem List   Diagnosis Date Noted  . Prediabetes 08/24/2019  . Shortness of breath 03/27/2019  . Dehydration 03/27/2019  . Constipation 02/05/2019  . Nausea 02/05/2019  . Strain of flank 09/23/2018  . Muscle strain 09/23/2018  . Flank pain 09/23/2018  . Finger pain, left 09/11/2018  . Skin infection 09/11/2018  . Essential hypertension 08/26/2018  . Class 2 severe obesity with serious comorbidity and body mass index (BMI) of 38.0 to 38.9 in adult [facility]) 08/26/2018    Current Status: Since his last office visit, he has had back surgery of Arthropathy of Lumbar with Dr. Tommy Medal on 07/03/2020. Today, he is doing fairly well and he has been out of work since April. He is currently under the care of Physical Therapy for chronic back pain. He has not been monitoring his blood pressure readings at home but can feel when pressure is high. He denies visual changes, chest pain, cough, shortness of breath, heart palpitations, and falls. He has occasional headaches and dizziness with position changes. Denies severe headaches, confusion, seizures, double vision, and blurred vision, nausea and vomiting. He denies fevers, chills, fatigue, recent infections, weight loss, and night sweats. Denies GI problems such as diarrhea, and constipation. He has no reports of blood in stools, dysuria and  hematuria. No depression or anxiety reported today. He is taking all medications as prescribed. He denies pain today.   Observations/Objective:  Telephone Virtual Visit  Assessment and Plan:  1. Hospital discharge follow-up  2. Arthropathy of lumbar facet joint Recent surgery.  3. Chronic back pain, unspecified back location, unspecified back pain laterality  4. Essential hypertension He will continue to take medications as prescribed, to decrease high sodium intake, excessive alcohol intake, increase potassium intake, smoking cessation, and increase physical activity of at least 30 minutes of cardio activity daily. He will continue to follow Heart Healthy or DASH diet.  5. Follow up He will follow up in 6 months for Annual Physical and Labwork.   No orders of the defined types were placed in this encounter.   No orders of the defined types were placed in this encounter.  Referral Orders  No referral(s) requested today    Raliegh Ip, MSN, ANE, FNP-BC [facility]/Internal Medicine/Sickle Cell Center [facility] [address] [place], [place] [address] [phone] fax           I discussed the assessment and treatment plan with the patient. The patient was provided an opportunity to ask questions and all were answered. The patient agreed with the plan and demonstrated an understanding of the instructions.   The patient was advised to call back or seek an in-person evaluation if the symptoms worsen or if the condition fails to improve as anticipated.  I provided 20 minutes of non-face-to-face time during this encounter.   Kallie Locks, FNP

## 2020-08-22 ENCOUNTER — Encounter: Payer: Self-pay | Admitting: Family Medicine

## 2020-10-19 ENCOUNTER — Other Ambulatory Visit: Payer: Self-pay | Admitting: Family Medicine

## 2020-10-19 DIAGNOSIS — I10 Essential (primary) hypertension: Secondary | ICD-10-CM

## 2020-11-15 ENCOUNTER — Other Ambulatory Visit: Payer: Self-pay | Admitting: Family Medicine

## 2020-11-15 DIAGNOSIS — I10 Essential (primary) hypertension: Secondary | ICD-10-CM

## 2020-12-31 ENCOUNTER — Other Ambulatory Visit: Payer: Self-pay | Admitting: Nurse Practitioner

## 2020-12-31 DIAGNOSIS — I10 Essential (primary) hypertension: Secondary | ICD-10-CM

## 2021-02-09 ENCOUNTER — Other Ambulatory Visit: Payer: Self-pay | Admitting: Nurse Practitioner

## 2021-02-09 DIAGNOSIS — I10 Essential (primary) hypertension: Secondary | ICD-10-CM

## 2021-03-19 ENCOUNTER — Ambulatory Visit: Payer: Self-pay | Admitting: Family Medicine

## 2021-03-19 ENCOUNTER — Ambulatory Visit: Payer: Self-pay | Admitting: Nurse Practitioner

## 2021-03-21 ENCOUNTER — Encounter: Payer: Self-pay | Admitting: Nurse Practitioner

## 2021-03-21 ENCOUNTER — Other Ambulatory Visit: Payer: Self-pay

## 2021-03-21 ENCOUNTER — Ambulatory Visit (INDEPENDENT_AMBULATORY_CARE_PROVIDER_SITE_OTHER): Payer: Self-pay | Admitting: Nurse Practitioner

## 2021-03-21 VITALS — BP 142/89 | HR 68 | Temp 98.0°F | Ht 71.0 in | Wt 295.0 lb

## 2021-03-21 DIAGNOSIS — Z125 Encounter for screening for malignant neoplasm of prostate: Secondary | ICD-10-CM

## 2021-03-21 DIAGNOSIS — E785 Hyperlipidemia, unspecified: Secondary | ICD-10-CM

## 2021-03-21 DIAGNOSIS — R7303 Prediabetes: Secondary | ICD-10-CM

## 2021-03-21 DIAGNOSIS — I10 Essential (primary) hypertension: Secondary | ICD-10-CM

## 2021-03-21 LAB — POCT GLYCOSYLATED HEMOGLOBIN (HGB A1C): Hemoglobin A1C: 6 % — AB (ref 4.0–5.6)

## 2021-03-21 MED ORDER — AMLODIPINE BESYLATE 10 MG PO TABS: 10.0000 mg | ORAL_TABLET | Freq: Every day | ORAL | 3 refills | Status: DC

## 2021-03-21 NOTE — Progress Notes (Signed)
[facility] Patient [facility] [address] [place], [place]  [address] Phone:  [phone]   Fax:  [phone]   Established Patient Office Visit  Subjective:  Patient ID: Jacob Freeman, male    DOB: August 16, 1970  Age: 50 y.o. MRN: [id number]  CC:  Chief Complaint  Patient presents with   Follow-up    No questions or concerns    HPI Jacob Freeman presents for follow up. A former patient of NP Stroud. He  has a past medical history of Chest pain, unspecified, Esophageal reflux, Hematuria, Hypercholesteremia, Hypertension, Nephrolithiasis, Pain in joint, hand, Prediabetes (07/2019), Vitamin D deficiency (01/2019), and Vitamin D deficiency (02/2020).   Hypertension Patient is here for follow-up of elevated blood pressure. He is exercising 2-3 times week and is adherent to a low-salt diet. He has some pulled muscle in neck and back.  Blood pressure is well controlled at home. Cardiac symptoms: none. He reports that if the diet changes he has some fluid retention.  Patient denies chest pain, chest pressure/discomfort, claudication, dyspnea, exertional chest pressure/discomfort, fatigue, irregular heart beat, lower extremity edema, near-syncope, orthopnea, palpitations, paroxysmal nocturnal dyspnea, syncope, and tachypnea. Cardiovascular risk factors: dyslipidemia, male gender, and obesity (BMI >= 30 kg/m2). Use of agents associated with hypertension: NSAIDS. History of target organ damage: none. He has some weakness in his legs; this varies.   Past Medical History:  Diagnosis Date   Chest pain, unspecified    Esophageal reflux    Hematuria    Hypercholesteremia    Hypertension    Nephrolithiasis    Pain in joint, hand    Prediabetes 07/2019   Vitamin D deficiency 01/2019   Vitamin D deficiency 02/2020    Past Surgical History:  Procedure Laterality Date   BACK SURGERY  06/2020   TONSILLECTOMY      Family History  Problem Relation Age of Onset   Brain cancer Father     Hypertension Mother     Social History   Socioeconomic History   Marital status: Married    Spouse name: Not on file   Number of children: Not on file   Years of education: Not on file   Highest education level: Not on file  Occupational History   Not on file  Tobacco Use   Smoking status: Never   Smokeless tobacco: Never  Vaping Use   Vaping Use: Never used  Substance and Sexual Activity   Alcohol use: No   Drug use: No   Sexual activity: Yes  Other Topics Concern   Not on file  Social History Narrative   Not on file   Social Determinants of Health   Financial Resource Strain: Not on file  Food Insecurity: Not on file  Transportation Needs: Not on file  Physical Activity: Not on file  Stress: Not on file  Social Connections: Not on file  Intimate Partner Violence: Not on file    Outpatient Medications Prior to Visit  Medication Sig Dispense Refill   acetaminophen (TYLENOL) 500 MG tablet Take 500 mg by mouth every 6 (six) hours as needed for mild pain.     gabapentin (NEURONTIN) 300 MG capsule Take 1 capsule (300 mg) by mouth 3 times a day for 4 weeks, if no side effects may increase to 2 capsules (600 mg) 3 times a day     meloxicam (MOBIC) 15 MG tablet Take 1 tablet by mouth daily.     tiZANidine (ZANAFLEX) 4 MG tablet Take 1 tablet by  mouth every 8 (eight) hours as needed.     amLODipine (NORVASC) 10 MG tablet Take 1 tablet by mouth once daily 30 tablet 3   rosuvastatin (CRESTOR) 10 MG tablet Take 1 tablet (10 mg total) by mouth daily. (Patient not taking: Reported on 03/21/2021) 30 tablet 6   Vitamin D, Ergocalciferol, (DRISDOL) 1.25 MG (50000 UT) CAPS capsule Take 1 capsule (50,000 Units total) by mouth every 7 (seven) days. (Patient not taking: Reported on 08/18/2020) 5 capsule 3   No facility-administered medications prior to visit.    No Known Allergies  ROS Review of Systems    Objective:    Physical Exam Constitutional:      Appearance: He is  obese.  HENT:     Head: Normocephalic and atraumatic.     Nose: Nose normal.     Mouth/Throat:     Mouth: Mucous membranes are moist.  Cardiovascular:     Rate and Rhythm: Normal rate and regular rhythm.     Pulses: Normal pulses.     Heart sounds: Normal heart sounds.  Pulmonary:     Effort: Pulmonary effort is normal.     Comments: diminshed Abdominal:     Comments: Increased abdominal girth   Musculoskeletal:        General: Tenderness present.     Cervical back: Normal range of motion.     Right lower leg: No edema.     Left lower leg: No edema.  Skin:    General: Skin is warm and dry.     Capillary Refill: Capillary refill takes less than 2 seconds.  Neurological:     General: No focal deficit present.     Mental Status: He is alert and oriented to person, place, and time.  Psychiatric:        Mood and Affect: Mood normal.        Behavior: Behavior normal.        Thought Content: Thought content normal.        Judgment: Judgment normal.    BP (!) 142/89   Pulse 68   Temp 98 F (36.7 C)   Ht 5\' 11"  (1.803 m)   Wt 295 lb (133.8 kg)   SpO2 95%   BMI 41.14 kg/m  Wt Readings from Last 3 Encounters:  03/21/21 295 lb (133.8 kg)  08/18/20 270 lb (122.5 kg)  08/24/19 270 lb 9.6 oz (122.7 kg)     Health Maintenance Due  Topic Date Due   INFLUENZA VACCINE  02/26/2021    There are no preventive care reminders to display for this patient.  Lab Results  Component Value Date   TSH 2.510 03/01/2020   Lab Results  Component Value Date   WBC 4.2 03/01/2020   HGB 14.0 03/01/2020   HCT 43.5 03/01/2020   MCV 80 03/01/2020   PLT 228 03/01/2020   Lab Results  Component Value Date   NA 143 03/01/2020   K 4.0 03/01/2020   CO2 25 03/01/2020   GLUCOSE 108 (H) 03/01/2020   BUN 9 03/01/2020   CREATININE 1.23 03/01/2020   BILITOT 0.4 03/01/2020   ALKPHOS 86 03/01/2020   AST 17 03/01/2020   ALT 24 03/01/2020   PROT 6.7 03/01/2020   ALBUMIN 4.4 03/01/2020    CALCIUM 9.0 03/01/2020   ANIONGAP 9 03/24/2019   Lab Results  Component Value Date   CHOL 212 (H) 03/01/2020   Lab Results  Component Value Date   HDL 47 03/01/2020  Lab Results  Component Value Date   LDLCALC 155 (H) 03/01/2020   Lab Results  Component Value Date   TRIG 56 03/01/2020   Lab Results  Component Value Date   CHOLHDL 4.5 03/01/2020   Lab Results  Component Value Date   HGBA1C 6.0 (A) 03/21/2021      Assessment & Plan:   Problem List Items Addressed This Visit       Cardiovascular and Mediastinum   Essential hypertension - Primary Encouraged on going compliance with current medication regimen Encouraged home monitoring and recording BP <130/80 Eating a heart-healthy diet with less salt Encouraged regular physical activity  Recommend Weight loss   Relevant Medications   amLODipine (NORVASC) 10 MG tablet   Other Relevant Orders   Urinalysis Dipstick   Comp. Metabolic Panel (12)     Other   Prediabetes Consider home glucose monitoring Weight loss at least 5% of current body weight is can be achieved with lifestyle modification dietary changes and regular daily exercise Encourage blood pressure control goal <120/80 and maintaining total cholesterol <200 Follow-up every 3 to 6 months for reevaluation Education material provided    Relevant Orders   Urinalysis Dipstick   HgB A1c (Completed)   Other Visit Diagnoses     Hyperlipidemia, unspecified hyperlipidemia type     Currently not using Crestor no apparent reason  Labs pending  Encourage heart healthy diet   Relevant Medications   amLODipine (NORVASC) 10 MG tablet   Other Relevant Orders   Lipid panel   Screening for malignant neoplasm of prostate       Relevant Orders   PSA       Meds ordered this encounter  Medications   amLODipine (NORVASC) 10 MG tablet    Sig: Take 1 tablet (10 mg total) by mouth daily.    Dispense:  90 tablet    Refill:  3    Order Specific Question:    Supervising Provider    Answer:   Quentin Angst [id number]    Follow-up: No follow-ups on file.    Barbette Merino, NP

## 2021-03-21 NOTE — Patient Instructions (Signed)
Managing Your Hypertension Hypertension, also called high blood pressure, is when the force of the blood pressing against the walls of the arteries is too strong. Arteries are blood vessels that carry blood from your heart throughout your body. Hypertension forces the heart to work harder to pump blood and may cause the arteries tobecome narrow or stiff. Understanding blood pressure readings Your personal target blood pressure may vary depending on your medical conditions, your age, and other factors. A blood pressure reading includes a higher number over a lower number. Ideally, your blood pressure should be below 120/80. You should know that: The first, or top, number is called the systolic pressure. It is a measure of the pressure in your arteries as your heart beats. The second, or bottom number, is called the diastolic pressure. It is a measure of the pressure in your arteries as the heart relaxes. Blood pressure is classified into four stages. Based on your blood pressure reading, your health care provider may use the following stages to determine what type of treatment you need, if any. Systolic pressure and diastolicpressure are measured in a unit called mmHg. Normal Systolic pressure: below 120. Diastolic pressure: below 80. Elevated Systolic pressure: 120-129. Diastolic pressure: below 80. Hypertension stage 1 Systolic pressure: 130-139. Diastolic pressure: 80-89. Hypertension stage 2 Systolic pressure: 140 or above. Diastolic pressure: 90 or above. How can this condition affect me? Managing your hypertension is an important responsibility. Over time, hypertension can damage the arteries and decrease blood flow to important parts of the body, including the brain, heart, and kidneys. Having untreated or uncontrolled hypertension can lead to: A heart attack. A stroke. A weakened blood vessel (aneurysm). Heart failure. Kidney damage. Eye damage. Metabolic syndrome. Memory and  concentration problems. Vascular dementia. What actions can I take to manage this condition? Hypertension can be managed by making lifestyle changes and possibly by taking medicines. Your health care provider will help you make a plan to bring yourblood pressure within a normal range. Nutrition  Eat a diet that is high in fiber and potassium, and low in salt (sodium), added sugar, and fat. An example eating plan is called the Dietary Approaches to Stop Hypertension (DASH) diet. To eat this way: Eat plenty of fresh fruits and vegetables. Try to fill one-half of your plate at each meal with fruits and vegetables. Eat whole grains, such as whole-wheat pasta, brown rice, or whole-grain bread. Fill about one-fourth of your plate with whole grains. Eat low-fat dairy products. Avoid fatty cuts of meat, processed or cured meats, and poultry with skin. Fill about one-fourth of your plate with lean proteins such as fish, chicken without skin, beans, eggs, and tofu. Avoid pre-made and processed foods. These tend to be higher in sodium, added sugar, and fat. Reduce your daily sodium intake. Most people with hypertension should eat less than 1,500 mg of sodium a day.  Lifestyle  Work with your health care provider to maintain a healthy body weight or to lose weight. Ask what an ideal weight is for you. Get at least 30 minutes of exercise that causes your heart to beat faster (aerobic exercise) most days of the week. Activities may include walking, swimming, or biking. Include exercise to strengthen your muscles (resistance exercise), such as weight lifting, as part of your weekly exercise routine. Try to do these types of exercises for 30 minutes at least 3 days a week. Do not use any products that contain nicotine or tobacco, such as cigarettes, e-cigarettes, and chewing   tobacco. If you need help quitting, ask your health care provider. Control any long-term (chronic) conditions you have, such as high  cholesterol or diabetes. Identify your sources of stress and find ways to manage stress. This may include meditation, deep breathing, or making time for fun activities.  Alcohol use Do not drink alcohol if: Your health care provider tells you not to drink. You are pregnant, may be pregnant, or are planning to become pregnant. If you drink alcohol: Limit how much you use to: 0-1 drink a day for women. 0-2 drinks a day for men. Be aware of how much alcohol is in your drink. In the [place] one drink equals one 12 oz bottle of beer (355 mL), one 5 oz glass of wine (148 mL), or one 1 oz glass of hard liquor (44 mL). Medicines Your health care provider may prescribe medicine if lifestyle changes are not enough to get your blood pressure under control and if: Your systolic blood pressure is 130 or higher. Your diastolic blood pressure is 80 or higher. Take medicines only as told by your health care provider. Follow the directions carefully. Blood pressure medicines must be taken as told by your health care provider. The medicine does not work as well when you skip doses. Skippingdoses also puts you at risk for problems. Monitoring Before you monitor your blood pressure: Do not smoke, drink caffeinated beverages, or exercise within 30 minutes before taking a measurement. Use the bathroom and empty your bladder (urinate). Sit quietly for at least 5 minutes before taking measurements. Monitor your blood pressure at home as told by your health care provider. To do this: Sit with your back straight and supported. Place your feet flat on the floor. Do not cross your legs. Support your arm on a flat surface, such as a table. Make sure your upper arm is at heart level. Each time you measure, take two or three readings one minute apart and record the results. You may also need to have your blood pressure checked regularly by your healthcare provider. General information Talk with your health care  provider about your diet, exercise habits, and other lifestyle factors that may be contributing to hypertension. Review all the medicines you take with your health care provider because there may be side effects or interactions. Keep all visits as told by your health care provider. Your health care provider can help you create and adjust your plan for managing your high blood pressure. Where to find more information National Heart, Lung, and Blood Institute: www.nhlbi.nih.gov American Heart Association: www.heart.org Contact a health care provider if: You think you are having a reaction to medicines you have taken. You have repeated (recurrent) headaches. You feel dizzy. You have swelling in your ankles. You have trouble with your vision. Get help right away if: You develop a severe headache or confusion. You have unusual weakness or numbness, or you feel faint. You have severe pain in your chest or abdomen. You vomit repeatedly. You have trouble breathing. These symptoms may represent a serious problem that is an emergency. Do not wait to see if the symptoms will go away. Get medical help right away. Call your local emergency services (911 in the [place] Do not drive yourself to the hospital. Summary Hypertension is when the force of blood pumping through your arteries is too strong. If this condition is not controlled, it may put you at risk for serious complications. Your personal target blood pressure may vary depending on your medical conditions,   your age, and other factors. For most people, a normal blood pressure is less than 120/80. Hypertension is managed by lifestyle changes, medicines, or both. Lifestyle changes to help manage hypertension include losing weight, eating a healthy, low-sodium diet, exercising more, stopping smoking, and limiting alcohol. This information is not intended to replace advice given to you by your health care provider. Make sure you discuss any questions  you have with your healthcare provider. Document Revised: 08/20/2019 Document Reviewed: 06/15/2019 Elsevier Patient Education  2022 [employer]  

## 2021-03-22 LAB — COMP. METABOLIC PANEL (12)
AST: 15 IU/L (ref 0–40)
Albumin/Globulin Ratio: 1.8 (ref 1.2–2.2)
Albumin: 4.5 g/dL (ref 4.0–5.0)
Alkaline Phosphatase: 92 IU/L (ref 44–121)
BUN/Creatinine Ratio: 5 — ABNORMAL LOW (ref 9–20)
BUN: 7 mg/dL (ref 6–24)
Bilirubin Total: 0.6 mg/dL (ref 0.0–1.2)
Calcium: 9.7 mg/dL (ref 8.7–10.2)
Chloride: 101 mmol/L (ref 96–106)
Creatinine, Ser: 1.28 mg/dL — ABNORMAL HIGH (ref 0.76–1.27)
Globulin, Total: 2.5 g/dL (ref 1.5–4.5)
Glucose: 105 mg/dL — ABNORMAL HIGH (ref 65–99)
Potassium: 3.9 mmol/L (ref 3.5–5.2)
Sodium: 140 mmol/L (ref 134–144)
Total Protein: 7 g/dL (ref 6.0–8.5)
eGFR: 68 mL/min/{1.73_m2} (ref >59)

## 2021-03-22 LAB — LIPID PANEL
Chol/HDL Ratio: 4.4 ratio (ref 0.0–5.0)
Cholesterol, Total: 227 mg/dL — ABNORMAL HIGH (ref 100–199)
HDL: 52 mg/dL (ref >39)
LDL Chol Calc (NIH): 162 mg/dL — ABNORMAL HIGH (ref 0–99)
Triglycerides: 73 mg/dL (ref 0–149)
VLDL Cholesterol Cal: 13 mg/dL (ref 5–40)

## 2021-03-22 LAB — PSA: Prostate Specific Ag, Serum: 0.7 ng/mL (ref 0.0–4.0)

## 2021-06-25 ENCOUNTER — Ambulatory Visit (INDEPENDENT_AMBULATORY_CARE_PROVIDER_SITE_OTHER): Payer: 59 | Admitting: Nurse Practitioner

## 2021-06-25 ENCOUNTER — Other Ambulatory Visit: Payer: Self-pay

## 2021-06-25 ENCOUNTER — Encounter: Payer: Self-pay | Admitting: Nurse Practitioner

## 2021-06-25 VITALS — BP 137/80 | HR 90 | Temp 97.2°F | Ht 71.0 in | Wt 287.0 lb

## 2021-06-25 DIAGNOSIS — I10 Essential (primary) hypertension: Secondary | ICD-10-CM | POA: Diagnosis not present

## 2021-06-25 DIAGNOSIS — E785 Hyperlipidemia, unspecified: Secondary | ICD-10-CM

## 2021-06-25 DIAGNOSIS — R7303 Prediabetes: Secondary | ICD-10-CM | POA: Diagnosis not present

## 2021-06-25 LAB — POCT GLYCOSYLATED HEMOGLOBIN (HGB A1C)
HbA1c POC (<> result, manual entry): 6.2 % (ref 4.0–5.6)
HbA1c, POC (controlled diabetic range): 6.2 % (ref 0.0–7.0)
HbA1c, POC (prediabetic range): 6.2 % (ref 5.7–6.4)
Hemoglobin A1C: 6.2 % — AB (ref 4.0–5.6)

## 2021-06-25 NOTE — Patient Instructions (Addendum)
Hypertension, Adult Hypertension is another name for high blood pressure. High blood pressure forces your heart to work harder to pump blood. This can cause problems over time. There are two numbers in a blood pressure reading. There is a top number (systolic) over a bottom number (diastolic). It is best to have a blood pressure that is below 120/80. Healthy choices can help lower your blood pressure, or you may need medicine to help lower it. What are the causes? The cause of this condition is not known. Some conditions may be related to high blood pressure. What increases the risk? Smoking. Having type 2 diabetes mellitus, high cholesterol, or both. Not getting enough exercise or physical activity. Being overweight. Having too much fat, sugar, calories, or salt (sodium) in your diet. Drinking too much alcohol. Having long-term (chronic) kidney disease. Having a family history of high blood pressure. Age. Risk increases with age. Race. You may be at higher risk if you are African American. Gender. Men are at higher risk than women before age 45. After age 65, women are at higher risk than men. Having obstructive sleep apnea. Stress. What are the signs or symptoms? High blood pressure may not cause symptoms. Very high blood pressure (hypertensive crisis) may cause: Headache. Feelings of worry or nervousness (anxiety). Shortness of breath. Nosebleed. A feeling of being sick to your stomach (nausea). Throwing up (vomiting). Changes in how you see. Very bad chest pain. Seizures. How is this treated? This condition is treated by making healthy lifestyle changes, such as: Eating healthy foods. Exercising more. Drinking less alcohol. Your health care provider may prescribe medicine if lifestyle changes are not enough to get your blood pressure under control, and if: Your top number is above 130. Your bottom number is above 80. Your personal target blood pressure may vary. Follow  these instructions at home: Eating and drinking  If told, follow the DASH eating plan. To follow this plan: Fill one half of your plate at each meal with fruits and vegetables. Fill one fourth of your plate at each meal with whole grains. Whole grains include whole-wheat pasta, brown rice, and whole-grain bread. Eat or drink low-fat dairy products, such as skim milk or low-fat yogurt. Fill one fourth of your plate at each meal with low-fat (lean) proteins. Low-fat proteins include fish, chicken without skin, eggs, beans, and tofu. Avoid fatty meat, cured and processed meat, or chicken with skin. Avoid pre-made or processed food. Eat less than 1,500 mg of salt each day. Do not drink alcohol if: Your doctor tells you not to drink. You are pregnant, may be pregnant, or are planning to become pregnant. If you drink alcohol: Limit how much you use to: 0-1 drink a day for women. 0-2 drinks a day for men. Be aware of how much alcohol is in your drink. In the [place] one drink equals one 12 oz bottle of beer (355 mL), one 5 oz glass of wine (148 mL), or one 1 oz glass of hard liquor (44 mL). Lifestyle  Work with your doctor to stay at a healthy weight or to lose weight. Ask your doctor what the best weight is for you. Get at least 30 minutes of exercise most days of the week. This may include walking, swimming, or biking. Get at least 30 minutes of exercise that strengthens your muscles (resistance exercise) at least 3 days a week. This may include lifting weights or doing Pilates. Do not use any products that contain nicotine or tobacco, such   as cigarettes, e-cigarettes, and chewing tobacco. If you need help quitting, ask your doctor. Check your blood pressure at home as told by your doctor. Keep all follow-up visits as told by your doctor. This is important. Medicines Take over-the-counter and prescription medicines only as told by your doctor. Follow directions carefully. Do not skip doses of  blood pressure medicine. The medicine does not work as well if you skip doses. Skipping doses also puts you at risk for problems. Ask your doctor about side effects or reactions to medicines that you should watch for. Contact a doctor if you: Think you are having a reaction to the medicine you are taking. Have headaches that keep coming back (recurring). Feel dizzy. Have swelling in your ankles. Have trouble with your vision. Get help right away if you: Get a very bad headache. Start to feel mixed up (confused). Feel weak or numb. Feel faint. Have very bad pain in your: Chest. Belly (abdomen). Throw up more than once. Have trouble breathing. Summary Hypertension is another name for high blood pressure. High blood pressure forces your heart to work harder to pump blood. For most people, a normal blood pressure is less than 120/80. Making healthy choices can help lower blood pressure. If your blood pressure does not get lower with healthy choices, you may need to take medicine. This information is not intended to replace advice given to you by your health care provider. Make sure you discuss any questions you have with your health care provider. Document Revised: 03/25/2018 Document Reviewed: 03/25/2018 Elsevier Patient Education  2022 [employer] Prediabetes Eating Plan Prediabetes is a condition that causes blood sugar (glucose) levels to be higher than normal. This increases the risk for developing type 2 diabetes (type 2 diabetes mellitus). Working with a health care provider or nutrition specialist (dietitian) to make diet and lifestyle changes can help prevent the onset of diabetes. These changes may help you: Control your blood glucose levels. Improve your cholesterol levels. Manage your blood pressure. What are tips for following this plan? Reading food labels Read food labels to check the amount of fat, salt (sodium), and sugar in prepackaged foods. Avoid foods that  have: Saturated fats. Trans fats. Added sugars. Avoid foods that have more than 300 milligrams (mg) of sodium per serving. Limit your sodium intake to less than 2,300 mg each day. Shopping Avoid buying pre-made and processed foods. Avoid buying drinks with added sugar. Cooking Cook with olive oil. Do not use butter, lard, or ghee. Bake, broil, grill, steam, or boil foods. Avoid frying. Meal planning  Work with your dietitian to create an eating plan that is right for you. This may include tracking how many calories you take in each day. Use a food diary, notebook, or mobile application to track what you eat at each meal. Consider following a Mediterranean diet. This includes: Eating several servings of fresh fruits and vegetables each day. Eating fish at least twice a week. Eating one serving each day of whole grains, beans, nuts, and seeds. Using olive oil instead of other fats. Limiting alcohol. Limiting red meat. Using nonfat or low-fat dairy products. Consider following a plant-based diet. This includes dietary choices that focus on eating mostly vegetables and fruit, grains, beans, nuts, and seeds. If you have high blood pressure, you may need to limit your sodium intake or follow a diet such as the DASH (Dietary Approaches to Stop Hypertension) eating plan. The DASH diet aims to lower high blood pressure. Lifestyle Set weight  loss goals with help from your health care team. It is recommended that most people with prediabetes lose 7% of their body weight. Exercise for at least 30 minutes 5 or more days a week. Attend a support group or seek support from a mental health counselor. Take over-the-counter and prescription medicines only as told by your health care provider. What foods are recommended? Fruits Berries. Bananas. Apples. Oranges. Grapes. Papaya. Mango. Pomegranate. Kiwi. Grapefruit. Cherries. Vegetables Lettuce. Spinach. Peas. Beets. Cauliflower. Cabbage. Broccoli.  Carrots. Tomatoes. Squash. Eggplant. Herbs. Peppers. Onions. Cucumbers. Brussels sprouts. Grains Whole grains, such as whole-wheat or whole-grain breads, crackers, cereals, and pasta. Unsweetened oatmeal. Bulgur. Barley. Quinoa. Brown rice. Corn or whole-wheat flour tortillas or taco shells. Meats and other proteins Seafood. Poultry without skin. Lean cuts of pork and beef. Tofu. Eggs. Nuts. Beans. Dairy Low-fat or fat-free dairy products, such as yogurt, cottage cheese, and cheese. Beverages Water. Tea. Coffee. Sugar-free or diet soda. Seltzer water. Low-fat or nonfat milk. Milk alternatives, such as soy or almond milk. Fats and oils Olive oil. Canola oil. Sunflower oil. Grapeseed oil. Avocado. Walnuts. Sweets and desserts Sugar-free or low-fat pudding. Sugar-free or low-fat ice cream and other frozen treats. Seasonings and condiments Herbs. Sodium-free spices. Mustard. Relish. Low-salt, low-sugar ketchup. Low-salt, low-sugar barbecue sauce. Low-fat or fat-free mayonnaise. The items listed above may not be a complete list of recommended foods and beverages. Contact a dietitian for more information. What foods are not recommended? Fruits Fruits canned with syrup. Vegetables Canned vegetables. Frozen vegetables with butter or cream sauce. Grains Refined white flour and flour products, such as bread, pasta, snack foods, and cereals. Meats and other proteins Fatty cuts of meat. Poultry with skin. Breaded or fried meat. Processed meats. Dairy Full-fat yogurt, cheese, or milk. Beverages Sweetened drinks, such as iced tea and soda. Fats and oils Butter. Lard. Ghee. Sweets and desserts Baked goods, such as cake, cupcakes, pastries, cookies, and cheesecake. Seasonings and condiments Spice mixes with added salt. Ketchup. Barbecue sauce. Mayonnaise. The items listed above may not be a complete list of foods and beverages that are not recommended. Contact a dietitian for more  information. Where to find more information American Diabetes Association: www.diabetes.org Summary You may need to make diet and lifestyle changes to help prevent the onset of diabetes. These changes can help you control blood sugar, improve cholesterol levels, and manage blood pressure. Set weight loss goals with help from your health care team. It is recommended that most people with prediabetes lose 7% of their body weight. Consider following a Mediterranean diet. This includes eating plenty of fresh fruits and vegetables, whole grains, beans, nuts, seeds, fish, and low-fat dairy, and using olive oil instead of other fats. This information is not intended to replace advice given to you by your health care provider. Make sure you discuss any questions you have with your health care provider. Document Revised: 10/14/2019 Document Reviewed: 10/14/2019 Elsevier Patient Education  2022 [employer].

## 2021-06-25 NOTE — Progress Notes (Signed)
[place], [place]  [address] Phone:  [phone]   Fax:  [phone]   Established Patient Office Visit  Subjective:  Patient ID: Jacob Freeman, male    DOB: 1970-08-16  Age: 50 y.o. MRN: [id number]  CC:  Chief Complaint  Patient presents with   Follow-up    3 month follow up;     HPI Jacob Freeman presents for follow up. He  has a past medical history of Chest pain, unspecified, Esophageal reflux, Hematuria, Hypercholesteremia, Hypertension, Nephrolithiasis, Pain in joint, hand, Prediabetes (07/2019), Vitamin D deficiency (01/2019), and Vitamin D deficiency (02/2020).    He is in today for follow-up for hypertension. He is exercising and eating well. He does monitor his BP at home and it is within this range. He does occasional dizziness if not taking his mediation. Denies dizziness, visual changes, shortness of breath, dyspnea on exertion, chest pain, nausea, vomiting or any edema.  He reports some recent constipation due to overeating over the Thanksgiving holiday.  He feels like the symptoms have resolved and are improving.  He desires to get his weight range to 240-250   Past Medical History:  Diagnosis Date   Chest pain, unspecified    Esophageal reflux    Hematuria    Hypercholesteremia    Hypertension    Nephrolithiasis    Pain in joint, hand    Prediabetes 07/2019   Vitamin D deficiency 01/2019   Vitamin D deficiency 02/2020    Past Surgical History:  Procedure Laterality Date   BACK SURGERY  06/2020   TONSILLECTOMY      Family History  Problem Relation Age of Onset   Brain cancer Father    Hypertension Mother     Social History   Socioeconomic History   Marital status: Married    Spouse name: Not on file   Number of children: Not on file   Years of education: Not on file   Highest education level: Not on file  Occupational History   Not on file  Tobacco Use   Smoking status: Never   Smokeless tobacco:  Never  Vaping Use   Vaping Use: Never used  Substance and Sexual Activity   Alcohol use: No   Drug use: No   Sexual activity: Yes  Other Topics Concern   Not on file  Social History Narrative   Not on file   Social Determinants of Health   Financial Resource Strain: Not on file  Food Insecurity: Not on file  Transportation Needs: Not on file  Physical Activity: Not on file  Stress: Not on file  Social Connections: Not on file  Intimate Partner Violence: Not on file    Outpatient Medications Prior to Visit  Medication Sig Dispense Refill   acetaminophen (TYLENOL) 500 MG tablet Take 500 mg by mouth every 6 (six) hours as needed for mild pain.     amLODipine (NORVASC) 10 MG tablet Take 1 tablet (10 mg total) by mouth daily. 90 tablet 3   tiZANidine (ZANAFLEX) 4 MG tablet Take 1 tablet by mouth every 8 (eight) hours as needed.     gabapentin (NEURONTIN) 300 MG capsule Take 1 capsule (300 mg) by mouth 3 times a day for 4 weeks, if no side effects may increase to 2 capsules (600 mg) 3 times a day     meloxicam (MOBIC) 15 MG tablet Take 1 tablet by mouth daily. (Patient not taking: Reported on 06/25/2021)  rosuvastatin (CRESTOR) 10 MG tablet Take 1 tablet (10 mg total) by mouth daily. (Patient not taking: Reported on 06/25/2021) 30 tablet 6   No facility-administered medications prior to visit.    No Known Allergies  ROS Review of Systems    Objective:    Physical Exam Constitutional:      Appearance: He is obese.  HENT:     Head: Normocephalic and atraumatic.     Nose: Nose normal.     Mouth/Throat:     Mouth: Mucous membranes are moist.  Cardiovascular:     Rate and Rhythm: Normal rate and regular rhythm.     Heart sounds: Murmur heard.  Pulmonary:     Effort: Pulmonary effort is normal.     Breath sounds: Normal breath sounds.  Abdominal:     General: Bowel sounds are normal.     Palpations: Abdomen is soft.     Comments: Increased abdominal girth    Musculoskeletal:        General: Normal range of motion.     Cervical back: Normal range of motion.     Right lower leg: No edema.     Left lower leg: No edema.  Skin:    General: Skin is warm and dry.     Capillary Refill: Capillary refill takes less than 2 seconds.  Neurological:     General: No focal deficit present.     Mental Status: He is alert and oriented to person, place, and time.  Psychiatric:        Mood and Affect: Mood normal.        Behavior: Behavior normal.   BP 137/80 (BP Location: Right Arm, Patient Position: Sitting)   Pulse 90   Temp (!) 97.2 F (36.2 C)   Ht _0  (1.803 m)   Wt 287 lb 0.4 oz (130.2 kg)   SpO2 99%   BMI 40.03 kg/m  Wt Readings from Last 3 Encounters:  06/25/21 287 lb 0.4 oz (130.2 kg)  03/21/21 295 lb (133.8 kg)  08/18/20 270 lb (122.5 kg)     There are no preventive care reminders to display for this patient.   There are no preventive care reminders to display for this patient.  Lab Results  Component Value Date   TSH 2.510 03/01/2020   Lab Results  Component Value Date   WBC 4.2 03/01/2020   HGB 14.0 03/01/2020   HCT 43.5 03/01/2020   MCV 80 03/01/2020   PLT 228 03/01/2020   Lab Results  Component Value Date   NA 140 03/21/2021   K 3.9 03/21/2021   CO2 25 03/01/2020   GLUCOSE 105 (H) 03/21/2021   BUN 7 03/21/2021   CREATININE 1.28 (H) 03/21/2021   BILITOT 0.6 03/21/2021   ALKPHOS 92 03/21/2021   AST 15 03/21/2021   ALT 24 03/01/2020   PROT 7.0 03/21/2021   ALBUMIN 4.5 03/21/2021   CALCIUM 9.7 03/21/2021   ANIONGAP 9 03/24/2019   EGFR 68 03/21/2021   Lab Results  Component Value Date   CHOL 227 (H) 03/21/2021   Lab Results  Component Value Date   HDL 52 03/21/2021   Lab Results  Component Value Date   LDLCALC 162 (H) 03/21/2021   Lab Results  Component Value Date   TRIG 73 03/21/2021   Lab Results  Component Value Date   CHOLHDL 4.4 03/21/2021   Lab Results  Component Value Date   HGBA1C  6.2 (A) 06/25/2021   HGBA1C 6.2 06/25/2021  HGBA1C 6.2 06/25/2021   HGBA1C 6.2 06/25/2021      Assessment & Plan:   Problem List Items Addressed This Visit       Cardiovascular and Mediastinum   Essential hypertension Stable Encouraged on going compliance with current medication regimen Encouraged home monitoring and recording BP <130/80 Eating a heart-healthy diet with less salt Encouraged regular physical activity  Recommend Weight loss     Relevant Orders   Comp. Metabolic Panel (12)     Other   Prediabetes - Primary Persistent A1c 3 months ago 6.0% currently 6.2% Consider home glucose monitoring Weight loss at least 5% of current body weight is can be achieved with lifestyle modification dietary changes and regular daily exercise Encourage blood pressure control goal <120/80 and maintaining total cholesterol <200 Follow-up every 3 to 6 months for reevaluation Education material provided    Relevant Orders   HgB A1c (Completed)   Other Visit Diagnoses     Hyperlipidemia, unspecified hyperlipidemia type       Relevant Orders   Lipid panel       No orders of the defined types were placed in this encounter.   Follow-up: Return in about 3 months (around 09/25/2021) for Follow up HTN [id number].    Vevelyn Francois, NP

## 2021-06-26 LAB — COMP. METABOLIC PANEL (12)
AST: 20 IU/L (ref 0–40)
Albumin/Globulin Ratio: 1.6 (ref 1.2–2.2)
Albumin: 4.1 g/dL (ref 4.0–5.0)
Alkaline Phosphatase: 74 IU/L (ref 44–121)
BUN/Creatinine Ratio: 8 — ABNORMAL LOW (ref 9–20)
BUN: 10 mg/dL (ref 6–24)
Bilirubin Total: 0.7 mg/dL (ref 0.0–1.2)
Calcium: 8.9 mg/dL (ref 8.7–10.2)
Chloride: 100 mmol/L (ref 96–106)
Creatinine, Ser: 1.29 mg/dL — ABNORMAL HIGH (ref 0.76–1.27)
Globulin, Total: 2.6 g/dL (ref 1.5–4.5)
Glucose: 101 mg/dL — ABNORMAL HIGH (ref 70–99)
Potassium: 3.4 mmol/L — ABNORMAL LOW (ref 3.5–5.2)
Sodium: 139 mmol/L (ref 134–144)
Total Protein: 6.7 g/dL (ref 6.0–8.5)
eGFR: 68 mL/min/{1.73_m2} (ref >59)

## 2021-06-26 LAB — LIPID PANEL
Chol/HDL Ratio: 4.5 ratio (ref 0.0–5.0)
Cholesterol, Total: 220 mg/dL — ABNORMAL HIGH (ref 100–199)
HDL: 49 mg/dL (ref >39)
LDL Chol Calc (NIH): 155 mg/dL — ABNORMAL HIGH (ref 0–99)
Triglycerides: 92 mg/dL (ref 0–149)
VLDL Cholesterol Cal: 16 mg/dL (ref 5–40)

## 2021-09-27 ENCOUNTER — Ambulatory Visit: Payer: Self-pay | Admitting: Nurse Practitioner

## 2021-11-29 ENCOUNTER — Ambulatory Visit (INDEPENDENT_AMBULATORY_CARE_PROVIDER_SITE_OTHER): Payer: Self-pay | Admitting: Nurse Practitioner

## 2021-11-29 ENCOUNTER — Encounter: Payer: Self-pay | Admitting: Nurse Practitioner

## 2021-11-29 VITALS — BP 149/101 | HR 74 | Temp 97.6°F | Ht 71.0 in | Wt 292.8 lb

## 2021-11-29 DIAGNOSIS — I10 Essential (primary) hypertension: Secondary | ICD-10-CM | POA: Diagnosis not present

## 2021-11-29 DIAGNOSIS — R7303 Prediabetes: Secondary | ICD-10-CM | POA: Diagnosis not present

## 2021-11-29 NOTE — Assessment & Plan Note (Signed)
-   CBC ?- Comprehensive metabolic panel ? ? ?2. Prediabetes ? ?- Hemoglobin A1c ? ? ?Follow up: ? ?Follow up in 6 months or sooner if needed ?

## 2021-11-29 NOTE — Patient Instructions (Addendum)
1. Essential hypertension ? ?- CBC ?- Comprehensive metabolic panel ? ? ?2. Prediabetes ? ?- Hemoglobin A1c ? ? ?Follow up: ? ?Follow up in 6 months or sooner if needed ? ? ? ?Managing Your Hypertension ?Hypertension, also called high blood pressure, is when the force of the blood pressing against the walls of the arteries is too strong. Arteries are blood vessels that carry blood from your heart throughout your body. Hypertension forces the heart to work harder to pump blood and may cause the arteries to become narrow or stiff. ?Understanding blood pressure readings ?A blood pressure reading includes a higher number over a lower number: ?The first, or top, number is called the systolic pressure. It is a measure of the pressure in your arteries as your heart beats. ?The second, or bottom number, is called the diastolic pressure. It is a measure of the pressure in your arteries as the heart relaxes. ?For most people, a normal blood pressure is below 120/80. Your personal target blood pressure may vary depending on your medical conditions, your age, and other factors. ?Blood pressure is classified into four stages. Based on your blood pressure reading, your health care provider may use the following stages to determine what type of treatment you need, if any. Systolic pressure and diastolic pressure are measured in a unit called millimeters of mercury (mmHg). ?Normal ?Systolic pressure: below 120. ?Diastolic pressure: below 80. ?Elevated ?Systolic pressure: 120-129. ?Diastolic pressure: below 80. ?Hypertension stage 1 ?Systolic pressure: 130-139. ?Diastolic pressure: 80-89. ?Hypertension stage 2 ?Systolic pressure: 140 or above. ?Diastolic pressure: 90 or above. ?How can this condition affect me? ?Managing your hypertension is very important. Over time, hypertension can damage the arteries and decrease blood flow to parts of the body, including the brain, heart, and kidneys. Having untreated or uncontrolled  hypertension can lead to: ?A heart attack. ?A stroke. ?A weakened blood vessel (aneurysm). ?Heart failure. ?Kidney damage. ?Eye damage. ?Memory and concentration problems. ?Vascular dementia. ?What actions can I take to manage this condition? ?Hypertension can be managed by making lifestyle changes and possibly by taking medicines. Your health care provider will help you make a plan to bring your blood pressure within a normal range. You may be referred for counseling on a healthy diet and physical activity. ?Nutrition ? ?Eat a diet that is high in fiber and potassium, and low in salt (sodium), added sugar, and fat. An example eating plan is called the DASH diet. DASH stands for Dietary Approaches to Stop Hypertension. To eat this way: ?Eat plenty of fresh fruits and vegetables. Try to fill one-half of your plate at each meal with fruits and vegetables. ?Eat whole grains, such as whole-wheat pasta, brown rice, or whole-grain bread. Fill about one-fourth of your plate with whole grains. ?Eat low-fat dairy products. ?Avoid fatty cuts of meat, processed or cured meats, and poultry with skin. Fill about one-fourth of your plate with lean proteins such as fish, chicken without skin, beans, eggs, and tofu. ?Avoid pre-made and processed foods. These tend to be higher in sodium, added sugar, and fat. ?Reduce your daily sodium intake. Many people with hypertension should eat less than 1,500 mg of sodium a day. ?Lifestyle ? ?Work with your health care provider to maintain a healthy body weight or to lose weight. Ask what an ideal weight is for you. ?Get at least 30 minutes of exercise that causes your heart to beat faster (aerobic exercise) most days of the week. Activities may include walking, swimming, or biking. ?Include  exercise to strengthen your muscles (resistance exercise), such as weight lifting, as part of your weekly exercise routine. Try to do these types of exercises for 30 minutes at least 3 days a week. ?Do not  use any products that contain nicotine or tobacco. These products include cigarettes, chewing tobacco, and vaping devices, such as e-cigarettes. If you need help quitting, ask your health care provider. ?Control any long-term (chronic) conditions you have, such as high cholesterol or diabetes. ?Identify your sources of stress and find ways to manage stress. This may include meditation, deep breathing, or making time for fun activities. ?Alcohol use ?Do not drink alcohol if: ?Your health care provider tells you not to drink. ?You are pregnant, may be pregnant, or are planning to become pregnant. ?If you drink alcohol: ?Limit how much you have to: ?0-1 drink a day for women. ?0-2 drinks a day for men. ?Know how much alcohol is in your drink. In the [place] one drink equals one 12 oz bottle of beer (355 mL), one 5 oz glass of wine (148 mL), or one 1? oz glass of hard liquor (44 mL). ?Medicines ?Your health care provider may prescribe medicine if lifestyle changes are not enough to get your blood pressure under control and if: ?Your systolic blood pressure is 130 or higher. ?Your diastolic blood pressure is 80 or higher. ?Take medicines only as told by your health care provider. Follow the directions carefully. Blood pressure medicines must be taken as told by your health care provider. The medicine does not work as well when you skip doses. Skipping doses also puts you at risk for problems. ?Monitoring ?Before you monitor your blood pressure: ?Do not smoke, drink caffeinated beverages, or exercise within 30 minutes before taking a measurement. ?Use the bathroom and empty your bladder (urinate). ?Sit quietly for at least 5 minutes before taking measurements. ?Monitor your blood pressure at home as told by your health care provider. To do this: ?Sit with your back straight and supported. ?Place your feet flat on the floor. Do not cross your legs. ?Support your arm on a flat surface, such as a table. Make sure your upper  arm is at heart level. ?Each time you measure, take two or three readings one minute apart and record the results. ?You may also need to have your blood pressure checked regularly by your health care provider. ?General information ?Talk with your health care provider about your diet, exercise habits, and other lifestyle factors that may be contributing to hypertension. ?Review all the medicines you take with your health care provider because there may be side effects or interactions. ?Keep all follow-up visits. Your health care provider can help you create and adjust your plan for managing your high blood pressure. ?Where to find more information ?National Heart, Lung, and Blood Institute: PopSteam.is ?American Heart Association: www.heart.org ?Contact a health care provider if: ?You think you are having a reaction to medicines you have taken. ?You have repeated (recurrent) headaches. ?You feel dizzy. ?You have swelling in your ankles. ?You have trouble with your vision. ?Get help right away if: ?You develop a severe headache or confusion. ?You have unusual weakness or numbness, or you feel faint. ?You have severe pain in your chest or abdomen. ?You vomit repeatedly. ?You have trouble breathing. ?These symptoms may be an emergency. Get help right away. Call 911. ?Do not wait to see if the symptoms will go away. ?Do not drive yourself to the hospital. ?Summary ?Hypertension is when the force of  blood pumping through your arteries is too strong. If this condition is not controlled, it may put you at risk for serious complications. ?Your personal target blood pressure may vary depending on your medical conditions, your age, and other factors. For most people, a normal blood pressure is less than 120/80. ?Hypertension is managed by lifestyle changes, medicines, or both. ?Lifestyle changes to help manage hypertension include losing weight, eating a healthy, low-sodium diet, exercising more, stopping smoking, and  limiting alcohol. ?This information is not intended to replace advice given to you by your health care provider. Make sure you discuss any questions you have with your health care provider. ?Document Revised: 09/0

## 2021-11-29 NOTE — Progress Notes (Signed)
@Patient  ID: , male    DOB: 1971/02/14, 51 y.o.   MRN: [id number] ? ?Chief Complaint  ?Patient presents with  ? Hypertension  ?  Patient is here today for her 3 month follow up with no concerns or issues to discuss.  ? ? ?Referring provider: ?[id number], NP ? ? ?HPI ? ?Barbette Merino presents for follow up. He  has a past medical history of Chest pain, unspecified, Esophageal reflux, Hematuria, Hypercholesteremia, Hypertension, Nephrolithiasis, Pain in joint, hand, Prediabetes (07/2019), Vitamin D deficiency (01/2019), and Vitamin D deficiency (02/2020).  ? ?Hypertension ROS: taking medications as instructed, no medication side effects noted, no TIA's, no chest pain on exertion, no dyspnea on exertion, and no swelling of ankles.  ?Follow ing a low salt diet and exercising. ?New concerns: none.  ? ? ? ? ? ?No Known Allergies ? ?Immunization History  ?Administered Date(s) Administered  ? Influenza,inj,Quad PF,6+ Mos 08/26/2018  ? Tdap 02/05/2019  ? ? ?Past Medical History:  ?Diagnosis Date  ? Chest pain, unspecified   ? Esophageal reflux   ? Hematuria   ? Hypercholesteremia   ? Hypertension   ? Nephrolithiasis   ? Pain in joint, hand   ? Prediabetes 07/2019  ? Vitamin D deficiency 01/2019  ? Vitamin D deficiency 02/2020  ? ? ?Tobacco History: ?Social History  ? ?Tobacco Use  ?Smoking Status Never  ?Smokeless Tobacco Never  ? ?Counseling given: Not Answered ? ? ?Outpatient Encounter Medications as of 11/29/2021  ?Medication Sig  ? acetaminophen (TYLENOL) 500 MG tablet Take 500 mg by mouth every 6 (six) hours as needed for mild pain.  ? amLODipine (NORVASC) 10 MG tablet Take 1 tablet (10 mg total) by mouth daily.  ? tiZANidine (ZANAFLEX) 4 MG tablet Take 1 tablet by mouth every 8 (eight) hours as needed.  ? meloxicam (MOBIC) 15 MG tablet Take 1 tablet by mouth daily. (Patient not taking: Reported on 06/25/2021)  ? rosuvastatin (CRESTOR) 10 MG tablet Take 1 tablet (10 mg total) by mouth daily. (Patient not  taking: Reported on 06/25/2021)  ? ?No facility-administered encounter medications on file as of 11/29/2021.  ? ? ? ?Review of Systems ? ?Review of Systems  ?Constitutional: Negative.   ?HENT: Negative.    ?Cardiovascular: Negative.   ?Gastrointestinal: Negative.   ?Allergic/Immunologic: Negative.   ?Neurological: Negative.   ?Psychiatric/Behavioral: Negative.     ? ? ? ?Physical Exam ? ?BP (!) 149/101   Pulse 74   Temp 97.6 ?F (36.4 ?C)   Ht 5\' 11"  (1.803 m)   Wt 292 lb 12.8 oz (132.8 kg)   SpO2 98%   BMI 40.84 kg/m?  ? ?Wt Readings from Last 5 Encounters:  ?11/29/21 292 lb 12.8 oz (132.8 kg)  ?06/25/21 287 lb 0.4 oz (130.2 kg)  ?03/21/21 295 lb (133.8 kg)  ?08/18/20 270 lb (122.5 kg)  ?08/24/19 270 lb 9.6 oz (122.7 kg)  ? ? ? ?Physical Exam ?Vitals and nursing note reviewed.  ?Constitutional:   ?   General: He is not in acute distress. ?   Appearance: He is well-developed.  ?Cardiovascular:  ?   Rate and Rhythm: Normal rate and regular rhythm.  ?Pulmonary:  ?   Effort: Pulmonary effort is normal.  ?   Breath sounds: Normal breath sounds.  ?Skin: ?   General: Skin is warm and dry.  ?Neurological:  ?   Mental Status: He is alert and oriented to person, place, and time.  ? ? ? ? ?Assessment &  Plan:  ? ?Essential hypertension ?- CBC ?- Comprehensive metabolic panel ? ? ?2. Prediabetes ? ?- Hemoglobin A1c ? ? ?Follow up: ? ?Follow up in 6 months or sooner if needed ? ? ? ? ?Ivonne Andrew, NP ?11/29/2021 ? ?

## 2021-11-30 LAB — COMPREHENSIVE METABOLIC PANEL
ALT: 22 IU/L (ref 0–44)
AST: 14 IU/L (ref 0–40)
Albumin/Globulin Ratio: 1.5 (ref 1.2–2.2)
Albumin: 4.5 g/dL (ref 3.8–4.9)
Alkaline Phosphatase: 106 IU/L (ref 44–121)
BUN/Creatinine Ratio: 7 — ABNORMAL LOW (ref 9–20)
BUN: 9 mg/dL (ref 6–24)
Bilirubin Total: 0.5 mg/dL (ref 0.0–1.2)
CO2: 24 mmol/L (ref 20–29)
Calcium: 9.4 mg/dL (ref 8.7–10.2)
Chloride: 105 mmol/L (ref 96–106)
Creatinine, Ser: 1.21 mg/dL (ref 0.76–1.27)
Globulin, Total: 3 g/dL (ref 1.5–4.5)
Glucose: 99 mg/dL (ref 70–99)
Potassium: 4.6 mmol/L (ref 3.5–5.2)
Sodium: 143 mmol/L (ref 134–144)
Total Protein: 7.5 g/dL (ref 6.0–8.5)
eGFR: 72 mL/min/{1.73_m2} (ref >59)

## 2021-11-30 LAB — CBC
Hematocrit: 46.7 % (ref 37.5–51.0)
Hemoglobin: 14.9 g/dL (ref 13.0–17.7)
MCH: 24.9 pg — ABNORMAL LOW (ref 26.6–33.0)
MCHC: 31.9 g/dL (ref 31.5–35.7)
MCV: 78 fL — ABNORMAL LOW (ref 79–97)
Platelets: 286 10*3/uL (ref 150–450)
RBC: 5.98 x10E6/uL — ABNORMAL HIGH (ref 4.14–5.80)
RDW: 15.9 % — ABNORMAL HIGH (ref 11.6–15.4)
WBC: 5 10*3/uL (ref 3.4–10.8)

## 2021-11-30 LAB — HEMOGLOBIN A1C
Est. average glucose Bld gHb Est-mCnc: 134 mg/dL
Hgb A1c MFr Bld: 6.3 % — ABNORMAL HIGH (ref 4.8–5.6)

## 2022-03-01 ENCOUNTER — Ambulatory Visit: Payer: Self-pay | Admitting: Nurse Practitioner

## 2022-03-27 ENCOUNTER — Ambulatory Visit: Payer: Self-pay | Admitting: Nurse Practitioner

## 2022-04-24 ENCOUNTER — Encounter: Payer: Self-pay | Admitting: Nurse Practitioner

## 2022-04-24 ENCOUNTER — Ambulatory Visit (INDEPENDENT_AMBULATORY_CARE_PROVIDER_SITE_OTHER): Payer: Commercial Managed Care - HMO | Admitting: Nurse Practitioner

## 2022-04-24 DIAGNOSIS — I10 Essential (primary) hypertension: Secondary | ICD-10-CM

## 2022-04-24 MED ORDER — AMLODIPINE BESYLATE 10 MG PO TABS: 10.0000 mg | ORAL_TABLET | Freq: Every day | ORAL | 3 refills | Status: DC
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
## 2022-04-24 NOTE — Patient Instructions (Addendum)
1. Essential hypertension  - amLODipine (NORVASC) 10 MG tablet; Take 1 tablet (10 mg total) by mouth daily.  Dispense: 90 tablet; Refill: 3    Follow up:  Follow up in 6 months or sooner

## 2022-04-24 NOTE — Assessment & Plan Note (Signed)
-   amLODipine (NORVASC) 10 MG tablet; Take 1 tablet (10 mg total) by mouth daily.  Dispense: 90 tablet; Refill: 3    Follow up:  Follow up in 6 months or sooner

## 2022-10-23 ENCOUNTER — Encounter: Payer: Self-pay | Admitting: Nurse Practitioner

## 2022-10-23 ENCOUNTER — Ambulatory Visit (INDEPENDENT_AMBULATORY_CARE_PROVIDER_SITE_OTHER): Payer: BLUE CROSS/BLUE SHIELD | Admitting: Nurse Practitioner

## 2022-10-23 VITALS — BP 130/75 | HR 73 | Temp 97.6°F | Wt 277.4 lb

## 2022-10-23 DIAGNOSIS — Z13220 Encounter for screening for lipoid disorders: Secondary | ICD-10-CM | POA: Diagnosis not present

## 2022-10-23 DIAGNOSIS — Z1211 Encounter for screening for malignant neoplasm of colon: Secondary | ICD-10-CM

## 2022-10-23 DIAGNOSIS — I10 Essential (primary) hypertension: Secondary | ICD-10-CM | POA: Diagnosis not present

## 2022-10-23 DIAGNOSIS — M5416 Radiculopathy, lumbar region: Secondary | ICD-10-CM | POA: Diagnosis not present

## 2022-10-23 DIAGNOSIS — R0683 Snoring: Secondary | ICD-10-CM | POA: Diagnosis not present

## 2022-10-23 MED ORDER — KETOROLAC TROMETHAMINE 60 MG/2ML IM SOLN
60.0000 mg | Freq: Once | INTRAMUSCULAR | Status: AC
  Administered 2022-10-23: 60 mg via INTRAMUSCULAR

## 2022-10-23 MED ORDER — PREDNISONE 20 MG PO TABS: 20.0000 mg | ORAL_TABLET | Freq: Every day | ORAL | 0 refills | Status: AC

## 2022-10-23 MED ORDER — DICLOFENAC SODIUM 75 MG PO TBEC: 75.0000 mg | DELAYED_RELEASE_TABLET | Freq: Two times a day (BID) | ORAL | 0 refills | Status: DC

## 2022-10-23 NOTE — Progress Notes (Unsigned)
@Patient  ID: Jacob Freeman, male    DOB: [birth year], 52 y.o.   MRN: [id number]  Chief Complaint  Patient presents with   Hypertension    Follow up med taken today   Back Pain   Foot Pain    Both     Referring provider: Fenton Foy, NP   HPI  Jacob Freeman presents for follow up. He  has a past medical history of Chest pain, unspecified, Esophageal reflux, Hematuria, Hypercholesteremia, Hypertension, Nephrolithiasis, Pain in joint, hand, Prediabetes (07/2019), Vitamin D deficiency (01/2019), and Vitamin D deficiency (02/2020).    Hypertension: taking medications as instructed, following low salt diet. Exercises some days. No medication side effects noted, no TIA's, no chest pain on exertion, no dyspnea on exertion, and no swelling of ankles.  Follow ing a low salt diet and exercising. New concerns: none.   Back pain started in 2020.     No Known Allergies  Immunization History  Administered Date(s) Administered   Influenza,inj,Quad PF,6+ Mos 08/26/2018   Tdap 02/05/2019    Past Medical History:  Diagnosis Date   Chest pain, unspecified    Esophageal reflux    Hematuria    Hypercholesteremia    Hypertension    Nephrolithiasis    Pain in joint, hand    Prediabetes 07/2019   Vitamin D deficiency 01/2019   Vitamin D deficiency 02/2020    Tobacco History: Social History   Tobacco Use  Smoking Status Never  Smokeless Tobacco Never   Counseling given: Not Answered   Outpatient Encounter Medications as of 10/23/2022  Medication Sig   acetaminophen (TYLENOL) 500 MG tablet Take 500 mg by mouth every 6 (six) hours as needed for mild pain.   amLODipine (NORVASC) 10 MG tablet Take 1 tablet (10 mg total) by mouth daily.   diclofenac (VOLTAREN) 75 MG EC tablet Take 1 tablet (75 mg total) by mouth 2 (two) times daily.   predniSONE (DELTASONE) 20 MG tablet Take 1 tablet (20 mg total) by mouth daily with breakfast for 5 days.   meloxicam (MOBIC) 15 MG tablet Take 1  tablet by mouth daily. (Patient not taking: Reported on 06/25/2021)   rosuvastatin (CRESTOR) 10 MG tablet Take 1 tablet (10 mg total) by mouth daily. (Patient not taking: Reported on 10/23/2022)   tiZANidine (ZANAFLEX) 4 MG tablet Take 1 tablet by mouth every 8 (eight) hours as needed. (Patient not taking: Reported on 04/24/2022)   Facility-Administered Encounter Medications as of 10/23/2022  Medication   ketorolac (TORADOL) injection 60 mg     Review of Systems  Review of Systems  Constitutional: Negative.   HENT: Negative.    Cardiovascular: Negative.   Gastrointestinal: Negative.   Allergic/Immunologic: Negative.   Neurological: Negative.   Psychiatric/Behavioral: Negative.         Physical Exam  BP 130/75   Pulse 73   Temp 97.6 F (36.4 C)   Wt 277 lb 6.4 oz (125.8 kg)   SpO2 99%   BMI 38.69 kg/m   Wt Readings from Last 5 Encounters:  10/23/22 277 lb 6.4 oz (125.8 kg)  04/24/22 (!) 300 lb 4 oz (136.2 kg)  11/29/21 292 lb 12.8 oz (132.8 kg)  06/25/21 287 lb 0.4 oz (130.2 kg)  03/21/21 295 lb (133.8 kg)     Physical Exam Vitals and nursing note reviewed.  Constitutional:      General: He is not in acute distress.    Appearance: He is well-developed.  Cardiovascular:  Rate and Rhythm: Normal rate and regular rhythm.  Pulmonary:     Effort: Pulmonary effort is normal.     Breath sounds: Normal breath sounds.  Skin:    General: Skin is warm and dry.  Neurological:     Mental Status: He is alert and oriented to person, place, and time.      Lab Results:  CBC    Component Value Date/Time   WBC 5.0 11/29/2021 0915   WBC 5.6 03/24/2019 1609   RBC 5.98 (H) 11/29/2021 0915   RBC 5.62 03/24/2019 1609   HGB 14.9 11/29/2021 0915   HCT 46.7 11/29/2021 0915   PLT 286 11/29/2021 0915   MCV 78 (L) 11/29/2021 0915   MCH 24.9 (L) 11/29/2021 0915   MCH 26.0 03/24/2019 1609   MCHC 31.9 11/29/2021 0915   MCHC 31.7 03/24/2019 1609   RDW 15.9 (H) 11/29/2021  0915   LYMPHSABS 1.4 03/01/2020 0840   MONOABS 0.4 03/24/2019 1609   EOSABS 0.2 03/01/2020 0840   BASOSABS 0.0 03/01/2020 0840    BMET    Component Value Date/Time   NA 143 11/29/2021 0915   K 4.6 11/29/2021 0915   CL 105 11/29/2021 0915   CO2 24 11/29/2021 0915   GLUCOSE 99 11/29/2021 0915   GLUCOSE 102 (H) 03/24/2019 1609   BUN 9 11/29/2021 0915   CREATININE 1.21 11/29/2021 0915   CALCIUM 9.4 11/29/2021 0915   GFRNONAA 69 03/01/2020 0840   GFRAA 79 03/01/2020 0840     Assessment & Plan:   No problem-specific Assessment & Plan notes found for this encounter.     Fenton Foy, NP 10/23/2022

## 2022-10-23 NOTE — Patient Instructions (Addendum)
1. Lumbar radiculopathy  - diclofenac (VOLTAREN) 75 MG EC tablet; Take 1 tablet (75 mg total) by mouth 2 (two) times daily.  Dispense: 30 tablet; Refill: 0 - predniSONE (DELTASONE) 20 MG tablet; Take 1 tablet (20 mg total) by mouth daily with breakfast for 5 days.  Dispense: 5 tablet; Refill: 0 - ketorolac (TORADOL) injection 60 mg - Ambulatory referral to Orthopedics   2. Colon cancer screening  - Cologuard   3. Snoring  - Home sleep test   4. Essential hypertension  - CBC - Comprehensive metabolic panel   5. Lipid screening  - Lipid Panel   Follow up:  Follow up 6 months

## 2022-10-24 ENCOUNTER — Encounter: Payer: Self-pay | Admitting: Nurse Practitioner

## 2022-10-24 DIAGNOSIS — M5416 Radiculopathy, lumbar region: Secondary | ICD-10-CM | POA: Insufficient documentation

## 2022-10-24 LAB — CBC
Hematocrit: 42.9 % (ref 37.5–51.0)
Hemoglobin: 13.6 g/dL (ref 13.0–17.7)
MCH: 24.9 pg — ABNORMAL LOW (ref 26.6–33.0)
MCHC: 31.7 g/dL (ref 31.5–35.7)
MCV: 79 fL (ref 79–97)
Platelets: 284 10*3/uL (ref 150–450)
RBC: 5.46 x10E6/uL (ref 4.14–5.80)
RDW: 14.5 % (ref 11.6–15.4)
WBC: 5.1 10*3/uL (ref 3.4–10.8)

## 2022-10-24 LAB — COMPREHENSIVE METABOLIC PANEL
ALT: 30 IU/L (ref 0–44)
AST: 15 IU/L (ref 0–40)
Albumin/Globulin Ratio: 1.5 (ref 1.2–2.2)
Albumin: 4.1 g/dL (ref 3.8–4.9)
Alkaline Phosphatase: 104 IU/L (ref 44–121)
BUN/Creatinine Ratio: 7 — ABNORMAL LOW (ref 9–20)
BUN: 8 mg/dL (ref 6–24)
Bilirubin Total: 0.5 mg/dL (ref 0.0–1.2)
CO2: 26 mmol/L (ref 20–29)
Calcium: 9.4 mg/dL (ref 8.7–10.2)
Chloride: 105 mmol/L (ref 96–106)
Creatinine, Ser: 1.11 mg/dL (ref 0.76–1.27)
Globulin, Total: 2.8 g/dL (ref 1.5–4.5)
Glucose: 102 mg/dL — ABNORMAL HIGH (ref 70–99)
Potassium: 3.9 mmol/L (ref 3.5–5.2)
Sodium: 142 mmol/L (ref 134–144)
Total Protein: 6.9 g/dL (ref 6.0–8.5)
eGFR: 80 mL/min/{1.73_m2} (ref >59)

## 2022-10-24 LAB — LIPID PANEL
Chol/HDL Ratio: 5.1 ratio — ABNORMAL HIGH (ref 0.0–5.0)
Cholesterol, Total: 200 mg/dL — ABNORMAL HIGH (ref 100–199)
HDL: 39 mg/dL — ABNORMAL LOW (ref >39)
LDL Chol Calc (NIH): 143 mg/dL — ABNORMAL HIGH (ref 0–99)
Triglycerides: 101 mg/dL (ref 0–149)
VLDL Cholesterol Cal: 18 mg/dL (ref 5–40)

## 2022-10-24 NOTE — Assessment & Plan Note (Signed)
-   diclofenac (VOLTAREN) 75 MG EC tablet; Take 1 tablet (75 mg total) by mouth 2 (two) times daily.  Dispense: 30 tablet; Refill: 0 - predniSONE (DELTASONE) 20 MG tablet; Take 1 tablet (20 mg total) by mouth daily with breakfast for 5 days.  Dispense: 5 tablet; Refill: 0 - ketorolac (TORADOL) injection 60 mg - Ambulatory referral to Orthopedics   2. Colon cancer screening  - Cologuard   3. Snoring  - Home sleep test   4. Essential hypertension  - CBC - Comprehensive metabolic panel   5. Lipid screening  - Lipid Panel   Follow up:  Follow up 6 months

## 2022-11-05 ENCOUNTER — Ambulatory Visit: Payer: BLUE CROSS/BLUE SHIELD | Admitting: Orthopaedic Surgery

## 2022-11-16 LAB — COLOGUARD: COLOGUARD: NEGATIVE

## 2023-02-04 ENCOUNTER — Telehealth: Payer: Self-pay | Admitting: Nurse Practitioner

## 2023-02-04 NOTE — Telephone Encounter (Signed)
Pt called and is asking about a letter about his condition and that he can't work.  He trying to get some government assistance .

## 2023-02-04 NOTE — Telephone Encounter (Signed)
Sent to [place] for approval. [place]

## 2023-02-06 ENCOUNTER — Other Ambulatory Visit: Payer: Self-pay | Admitting: Nurse Practitioner

## 2023-02-06 DIAGNOSIS — M5416 Radiculopathy, lumbar region: Secondary | ICD-10-CM

## 2023-02-06 NOTE — Telephone Encounter (Signed)
Called pt and advise message. Gh 

## 2023-04-18 ENCOUNTER — Other Ambulatory Visit: Payer: Self-pay | Admitting: Nurse Practitioner

## 2023-04-18 DIAGNOSIS — I10 Essential (primary) hypertension: Secondary | ICD-10-CM

## 2023-04-25 ENCOUNTER — Ambulatory Visit: Payer: Self-pay | Admitting: Nurse Practitioner

## 2023-04-30 ENCOUNTER — Encounter: Payer: Self-pay | Admitting: Nurse Practitioner

## 2023-04-30 ENCOUNTER — Ambulatory Visit (INDEPENDENT_AMBULATORY_CARE_PROVIDER_SITE_OTHER): Payer: BLUE CROSS/BLUE SHIELD | Admitting: Nurse Practitioner

## 2023-04-30 VITALS — BP 120/79 | HR 71 | Temp 97.0°F | Wt 265.0 lb

## 2023-04-30 DIAGNOSIS — Z13220 Encounter for screening for lipoid disorders: Secondary | ICD-10-CM | POA: Diagnosis not present

## 2023-04-30 DIAGNOSIS — I10 Essential (primary) hypertension: Secondary | ICD-10-CM

## 2023-04-30 NOTE — Progress Notes (Signed)
Subjective   Patient ID: Jacob Freeman, male    DOB: March 31, 1971, 52 y.o.   MRN: [id number]  Chief Complaint  Patient presents with   Hypertension    Referring provider: Ivonne Andrew, NP  Jacob Freeman is a 52 y.o. male with Past Medical History: No date: Chest pain, unspecified No date: Esophageal reflux No date: Hematuria No date: Hypercholesteremia No date: Hypertension No date: Nephrolithiasis No date: Pain in joint, hand 07/2019: Prediabetes 01/2019: Vitamin D deficiency 02/2020: Vitamin D deficiency   HPI  Hypertension: taking medications as instructed, following low salt diet. Exercises some days. No medication side effects noted, no TIA's, no chest pain on exertion, no dyspnea on exertion, and no swelling of ankles.  Follow ing a low salt diet and exercising. New concerns: none.   Note: Patient is currently noncompliant with cholesterol medication.  We will recheck lipid panel today and if still elevated will send in refill.    No Known Allergies  Immunization History  Administered Date(s) Administered   Influenza,inj,Quad PF,6+ Mos 08/26/2018   Tdap 02/05/2019    Tobacco History: Social History   Tobacco Use  Smoking Status Never  Smokeless Tobacco Never   Counseling given: Not Answered   Outpatient Encounter Medications as of 04/30/2023  Medication Sig   acetaminophen (TYLENOL) 500 MG tablet Take 500 mg by mouth every 6 (six) hours as needed for mild pain.   amLODipine (NORVASC) 10 MG tablet Take 1 tablet by mouth once daily   diclofenac (VOLTAREN) 75 MG EC tablet Take 1 tablet (75 mg total) by mouth 2 (two) times daily. (Patient not taking: Reported on 04/30/2023)   meloxicam (MOBIC) 15 MG tablet Take 1 tablet by mouth daily. (Patient not taking: Reported on 06/25/2021)   rosuvastatin (CRESTOR) 10 MG tablet Take 1 tablet (10 mg total) by mouth daily. (Patient not taking: Reported on 10/23/2022)   tiZANidine (ZANAFLEX) 4 MG tablet Take 1 tablet by  mouth every 8 (eight) hours as needed. (Patient not taking: Reported on 04/24/2022)   No facility-administered encounter medications on file as of 04/30/2023.    Review of Systems  Review of Systems  Constitutional: Negative.   HENT: Negative.    Cardiovascular: Negative.   Gastrointestinal: Negative.   Allergic/Immunologic: Negative.   Neurological: Negative.   Psychiatric/Behavioral: Negative.       Objective:   BP 120/79   Pulse 71   Temp (!) 97 F (36.1 C)   Wt 265 lb (120.2 kg)   SpO2 100%   BMI 36.96 kg/m   Wt Readings from Last 5 Encounters:  04/30/23 265 lb (120.2 kg)  10/23/22 277 lb 6.4 oz (125.8 kg)  04/24/22 (!) 300 lb 4 oz (136.2 kg)  11/29/21 292 lb 12.8 oz (132.8 kg)  06/25/21 287 lb 0.4 oz (130.2 kg)     Physical Exam Vitals and nursing note reviewed.  Constitutional:      General: He is not in acute distress.    Appearance: He is well-developed.  Cardiovascular:     Rate and Rhythm: Normal rate and regular rhythm.  Pulmonary:     Effort: Pulmonary effort is normal.     Breath sounds: Normal breath sounds.  Skin:    General: Skin is warm and dry.  Neurological:     Mental Status: He is alert and oriented to person, place, and time.       Assessment & Plan:   Lipid screening -     Lipid panel  Essential  hypertension -     CBC -     Comprehensive metabolic panel     Return in about 6 months (around 10/29/2023).   Ivonne Andrew, NP 04/30/2023

## 2023-04-30 NOTE — Patient Instructions (Signed)
1. Lipid screening  - Lipid Panel   2. Essential hypertension  - CBC - Comprehensive metabolic panel   Follow up:  Follow up in 6 months

## 2023-05-01 LAB — COMPREHENSIVE METABOLIC PANEL
ALT: 23 [IU]/L (ref 0–44)
AST: 17 [IU]/L (ref 0–40)
Albumin: 3.8 g/dL (ref 3.8–4.9)
Alkaline Phosphatase: 73 [IU]/L (ref 44–121)
BUN/Creatinine Ratio: 6 — ABNORMAL LOW (ref 9–20)
BUN: 7 mg/dL (ref 6–24)
Bilirubin Total: 0.7 mg/dL (ref 0.0–1.2)
CO2: 25 mmol/L (ref 20–29)
Calcium: 9.1 mg/dL (ref 8.7–10.2)
Chloride: 105 mmol/L (ref 96–106)
Creatinine, Ser: 1.25 mg/dL (ref 0.76–1.27)
Globulin, Total: 2.3 g/dL (ref 1.5–4.5)
Glucose: 96 mg/dL (ref 70–99)
Potassium: 3.9 mmol/L (ref 3.5–5.2)
Sodium: 143 mmol/L (ref 134–144)
Total Protein: 6.1 g/dL (ref 6.0–8.5)
eGFR: 69 mL/min/{1.73_m2} (ref >59)

## 2023-05-01 LAB — CBC
Hematocrit: 46.3 % (ref 37.5–51.0)
Hemoglobin: 14.1 g/dL (ref 13.0–17.7)
MCH: 25.1 pg — ABNORMAL LOW (ref 26.6–33.0)
MCHC: 30.5 g/dL — ABNORMAL LOW (ref 31.5–35.7)
MCV: 82 fL (ref 79–97)
Platelets: 261 10*3/uL (ref 150–450)
RBC: 5.62 x10E6/uL (ref 4.14–5.80)
RDW: 14.8 % (ref 11.6–15.4)
WBC: 4.9 10*3/uL (ref 3.4–10.8)

## 2023-05-01 LAB — LIPID PANEL
Chol/HDL Ratio: 4.3 {ratio} (ref 0.0–5.0)
Cholesterol, Total: 217 mg/dL — ABNORMAL HIGH (ref 100–199)
HDL: 50 mg/dL (ref >39)
LDL Chol Calc (NIH): 155 mg/dL — ABNORMAL HIGH (ref 0–99)
Triglycerides: 68 mg/dL (ref 0–149)
VLDL Cholesterol Cal: 12 mg/dL (ref 5–40)

## 2023-05-16 ENCOUNTER — Encounter: Payer: Self-pay | Admitting: Nurse Practitioner

## 2023-05-16 ENCOUNTER — Ambulatory Visit (INDEPENDENT_AMBULATORY_CARE_PROVIDER_SITE_OTHER): Payer: BLUE CROSS/BLUE SHIELD | Admitting: Nurse Practitioner

## 2023-05-16 VITALS — BP 123/78 | HR 91 | Ht 71.0 in | Wt 270.0 lb

## 2023-05-16 DIAGNOSIS — S91212A Laceration without foreign body of left great toe with damage to nail, initial encounter: Secondary | ICD-10-CM | POA: Diagnosis not present

## 2023-05-16 NOTE — Progress Notes (Signed)
Established Patient Office Visit  Subjective:  Patient ID: Jacob Freeman, male    DOB: 1971-03-05  Age: 52 y.o. MRN: [id number]  CC:  Chief Complaint  Patient presents with   Nail Problem    LEFT BIG TOE NAIL    HPI Jacob Freeman is a 52 y.o. male  has a past medical history of Chest pain, unspecified, Esophageal reflux, Hematuria, Hypercholesteremia, Hypertension, Nephrolithiasis, Pain in joint, hand, Prediabetes (07/2019), Vitamin D deficiency (01/2019), and Vitamin D deficiency (02/2020).    Patient presents with left great toenail laceration that happened today after he accidentally hit his toe on a wall while going to the bathroom.  He reports throbbing pain and some bleeding.  No fever, chills, chest pain, shortness of breath abdominal pain nausea vomiting, numbness, tingling   Past Medical History:  Diagnosis Date   Chest pain, unspecified    Esophageal reflux    Hematuria    Hypercholesteremia    Hypertension    Nephrolithiasis    Pain in joint, hand    Prediabetes 07/2019   Vitamin D deficiency 01/2019   Vitamin D deficiency 02/2020    Past Surgical History:  Procedure Laterality Date   BACK SURGERY  06/2020   TONSILLECTOMY      Family History  Problem Relation Age of Onset   Brain cancer Father    Hypertension Mother     Social History   Socioeconomic History   Marital status: Married    Spouse name: Not on file   Number of children: Not on file   Years of education: Not on file   Highest education level: Not on file  Occupational History   Not on file  Tobacco Use   Smoking status: Never   Smokeless tobacco: Never  Vaping Use   Vaping status: Never Used  Substance and Sexual Activity   Alcohol use: No   Drug use: No   Sexual activity: Yes  Other Topics Concern   Not on file  Social History Narrative   Not on file   Social Determinants of Health   Financial Resource Strain: Not on file  Food Insecurity: Not on file  Transportation Needs:  Not on file  Physical Activity: Not on file  Stress: Not on file  Social Connections: Not on file  Intimate Partner Violence: Not on file    Outpatient Medications Prior to Visit  Medication Sig Dispense Refill   acetaminophen (TYLENOL) 500 MG tablet Take 500 mg by mouth every 6 (six) hours as needed for mild pain.     amLODipine (NORVASC) 10 MG tablet Take 1 tablet by mouth once daily 90 tablet 0   diclofenac (VOLTAREN) 75 MG EC tablet Take 1 tablet (75 mg total) by mouth 2 (two) times daily. (Patient not taking: Reported on 04/30/2023) 30 tablet 0   meloxicam (MOBIC) 15 MG tablet Take 1 tablet by mouth daily. (Patient not taking: Reported on 06/25/2021)     rosuvastatin (CRESTOR) 10 MG tablet Take 1 tablet (10 mg total) by mouth daily. (Patient not taking: Reported on 10/23/2022) 30 tablet 6   tiZANidine (ZANAFLEX) 4 MG tablet Take 1 tablet by mouth every 8 (eight) hours as needed. (Patient not taking: Reported on 04/24/2022)     No facility-administered medications prior to visit.    No Known Allergies  ROS Review of Systems  Constitutional:  Negative for appetite change, chills, fatigue and fever.  HENT:  Negative for congestion, postnasal drip, rhinorrhea and sneezing.   Respiratory:  Negative for cough, shortness of breath and wheezing.   Cardiovascular:  Negative for chest pain, palpitations and leg swelling.  Gastrointestinal:  Negative for abdominal pain, constipation, nausea and vomiting.  Genitourinary:  Negative for difficulty urinating, dysuria, flank pain and frequency.  Musculoskeletal:  Negative for back pain, joint swelling and myalgias.  Skin:  Negative for color change, pallor, rash and wound.  Neurological:  Negative for dizziness, facial asymmetry, weakness, numbness and headaches.  Psychiatric/Behavioral:  Negative for behavioral problems, confusion, self-injury and suicidal ideas.       Objective:    Physical Exam Vitals and nursing note reviewed.   Constitutional:      General: He is not in acute distress.    Appearance: Normal appearance. He is obese. He is not ill-appearing, toxic-appearing or diaphoretic.  HENT:     Mouth/Throat:     Mouth: Mucous membranes are moist.     Pharynx: Oropharynx is clear. No oropharyngeal exudate or posterior oropharyngeal erythema.  Eyes:     General: No scleral icterus.       Right eye: No discharge.        Left eye: No discharge.     Extraocular Movements: Extraocular movements intact.     Conjunctiva/sclera: Conjunctivae normal.  Cardiovascular:     Rate and Rhythm: Normal rate and regular rhythm.     Pulses: Normal pulses.     Heart sounds: Normal heart sounds. No murmur heard.    No friction rub. No gallop.  Pulmonary:     Effort: Pulmonary effort is normal. No respiratory distress.     Breath sounds: Normal breath sounds. No stridor. No wheezing, rhonchi or rales.  Chest:     Chest wall: No tenderness.  Abdominal:     General: There is no distension.     Palpations: Abdomen is soft.     Tenderness: There is no abdominal tenderness. There is no right CVA tenderness, left CVA tenderness or guarding.  Musculoskeletal:        General: No swelling, tenderness, deformity or signs of injury.     Right lower leg: No edema.     Left lower leg: No edema.  Skin:    General: Skin is warm and dry.     Capillary Refill: Capillary refill takes less than 2 seconds.     Coloration: Skin is not jaundiced or pale.     Findings: No bruising, erythema or lesion.     Comments: Left great toenail small laceration, minimal bleeding noted.  Skin warm and dry  Neurological:     Mental Status: He is alert and oriented to person, place, and time.     Motor: No weakness.     Coordination: Coordination normal.     Gait: Gait normal.  Psychiatric:        Mood and Affect: Mood normal.        Behavior: Behavior normal.        Thought Content: Thought content normal.        Judgment: Judgment normal.      BP 123/78   Pulse 91   Ht 5\' 11"  (1.803 m)   Wt 270 lb (122.5 kg)   SpO2 98%   BMI 37.66 kg/m  Wt Readings from Last 3 Encounters:  05/16/23 270 lb (122.5 kg)  04/30/23 265 lb (120.2 kg)  10/23/22 277 lb 6.4 oz (125.8 kg)    Lab Results  Component Value Date   TSH 2.510 03/01/2020   Lab Results  Component Value Date   WBC 4.9 04/30/2023   HGB 14.1 04/30/2023   HCT 46.3 04/30/2023   MCV 82 04/30/2023   PLT 261 04/30/2023   Lab Results  Component Value Date   NA 143 04/30/2023   K 3.9 04/30/2023   CO2 25 04/30/2023   GLUCOSE 96 04/30/2023   BUN 7 04/30/2023   CREATININE 1.25 04/30/2023   BILITOT 0.7 04/30/2023   ALKPHOS 73 04/30/2023   AST 17 04/30/2023   ALT 23 04/30/2023   PROT 6.1 04/30/2023   ALBUMIN 3.8 04/30/2023   CALCIUM 9.1 04/30/2023   ANIONGAP 9 03/24/2019   EGFR 69 04/30/2023   Lab Results  Component Value Date   CHOL 217 (H) 04/30/2023   Lab Results  Component Value Date   HDL 50 04/30/2023   Lab Results  Component Value Date   LDLCALC 155 (H) 04/30/2023   Lab Results  Component Value Date   TRIG 68 04/30/2023   Lab Results  Component Value Date   CHOLHDL 4.3 04/30/2023   Lab Results  Component Value Date   HGBA1C 6.3 (H) 11/29/2021      Assessment & Plan:   Problem List Items Addressed This Visit       Musculoskeletal and Integument   Laceration of left great toe with damage to nail, initial encounter - Primary    Patient encouraged to take Tylenol 650 mg every 6 hours as needed for pain Keep site clean and dry to prevent infection He Is up-to-date with Tdap vaccine Encouraged to report fever, chills, purulent drainage, numbness Work note provided  Addendum. Will empirically treat with  keflex. I have called the patient and updated him. Stated that he is doing fine, wound draining clear liquid   cephALEXin (KEFLEX) 500 MG capsule         Take 1 capsule (500 mg total) by mouth 4 (four) times daily for 7 days.         Relevant Medications   cephALEXin (KEFLEX) 500 MG capsule    Meds ordered this encounter  Medications   cephALEXin (KEFLEX) 500 MG capsule    Sig: Take 1 capsule (500 mg total) by mouth 4 (four) times daily for 7 days.    Dispense:  28 capsule    Refill:  0    Follow-up: No follow-ups on file.    Donell Beers, FNP

## 2023-05-16 NOTE — Assessment & Plan Note (Signed)
Patient encouraged to take Tylenol 650 mg every 6 hours as needed for pain Keep site clean and dry to prevent infection He Is up-to-date with Tdap vaccine Encouraged to report fever, chills, purulent drainage, numbness Work note provided  Addendum. Will empirically treat with  keflex. I have called the patient and updated him. Stated that he is doing fine, wound draining clear liquid   cephALEXin (KEFLEX) 500 MG capsule         Take 1 capsule (500 mg total) by mouth 4 (four) times daily for 7 days.

## 2023-05-16 NOTE — Patient Instructions (Addendum)
Please take Tylenol 650 mg every 6 hours as needed for pain Please report fever, chills, increased pain, greenish-yellowish discharge, loss of sensation Keep sites clean and dry   It is important that you exercise regularly at least 30 minutes 5 times a week as tolerated  Think about what you will eat, plan ahead. Choose " clean, green, fresh or frozen" over canned, processed or packaged foods which are more sugary, salty and fatty. 70 to 75% of food eaten should be vegetables and fruit. Three meals at set times with snacks allowed between meals, but they must be fruit or vegetables. Aim to eat over a 12 hour period , example 7 am to 7 pm, and STOP after  your last meal of the day. Drink water,generally about 64 ounces per day, no other drink is as healthy. Fruit juice is best enjoyed in a healthy way, by EATING the fruit.  Thanks for choosing [facility] we consider it a privelige to serve you.

## 2023-05-19 MED ORDER — CEPHALEXIN 500 MG PO CAPS
500.0000 mg | ORAL_CAPSULE | Freq: Four times a day (QID) | ORAL | 0 refills | Status: DC
Start: 2023-05-19 — End: 2023-05-23

## 2023-05-19 NOTE — Addendum Note (Signed)
Addended by: Donell Beers on: 05/19/2023 09:33 AM   Modules accepted: Orders

## 2023-05-23 ENCOUNTER — Other Ambulatory Visit: Payer: Self-pay | Admitting: Nurse Practitioner

## 2023-05-23 ENCOUNTER — Telehealth: Payer: Self-pay

## 2023-05-23 DIAGNOSIS — S91212A Laceration without foreign body of left great toe with damage to nail, initial encounter: Secondary | ICD-10-CM

## 2023-05-23 MED ORDER — CEPHALEXIN 500 MG PO CAPS
500.0000 mg | ORAL_CAPSULE | Freq: Four times a day (QID) | ORAL | 0 refills | Status: AC
Start: 2023-05-23 — End: 2023-05-25

## 2023-05-23 NOTE — Telephone Encounter (Signed)
Pt called to advise that he needs a two day refill on his abx he was just prescribed. The bottle was wash and ruined the last two doses. Please advise [facility]

## 2023-07-17 ENCOUNTER — Other Ambulatory Visit: Payer: Self-pay | Admitting: Nurse Practitioner

## 2023-07-17 DIAGNOSIS — I10 Essential (primary) hypertension: Secondary | ICD-10-CM

## 2023-08-25 ENCOUNTER — Encounter (HOSPITAL_COMMUNITY): Payer: Self-pay

## 2023-08-25 ENCOUNTER — Emergency Department (HOSPITAL_COMMUNITY)
Admission: EM | Admit: 2023-08-25 | Discharge: 2023-08-25 | Disposition: A | Discharge status: Home / Self Care | Payer: Medicaid Other | Attending: Emergency Medicine | Admitting: Emergency Medicine

## 2023-08-25 ENCOUNTER — Other Ambulatory Visit: Payer: Self-pay

## 2023-08-25 ENCOUNTER — Emergency Department (HOSPITAL_COMMUNITY): Payer: Medicaid Other

## 2023-08-25 DIAGNOSIS — J181 Lobar pneumonia, unspecified organism: Secondary | ICD-10-CM | POA: Insufficient documentation

## 2023-08-25 DIAGNOSIS — Z79899 Other long term (current) drug therapy: Secondary | ICD-10-CM | POA: Insufficient documentation

## 2023-08-25 DIAGNOSIS — Z20822 Contact with and (suspected) exposure to covid-19: Secondary | ICD-10-CM | POA: Insufficient documentation

## 2023-08-25 DIAGNOSIS — I10 Essential (primary) hypertension: Secondary | ICD-10-CM | POA: Diagnosis not present

## 2023-08-25 DIAGNOSIS — J189 Pneumonia, unspecified organism: Secondary | ICD-10-CM

## 2023-08-25 DIAGNOSIS — R0789 Other chest pain: Secondary | ICD-10-CM | POA: Diagnosis present

## 2023-08-25 LAB — CBC WITH DIFFERENTIAL/PLATELET
Abs Immature Granulocytes: 0.01 10*3/uL (ref 0.00–0.07)
Basophils Absolute: 0.1 10*3/uL (ref 0.0–0.1)
Basophils Relative: 1 %
Eosinophils Absolute: 0.1 10*3/uL (ref 0.0–0.5)
Eosinophils Relative: 2 %
HCT: 41.7 % (ref 39.0–52.0)
Hemoglobin: 13 g/dL (ref 13.0–17.0)
Immature Granulocytes: 0 %
Lymphocytes Relative: 14 %
Lymphs Abs: 1 10*3/uL (ref 0.7–4.0)
MCH: 25.8 pg — ABNORMAL LOW (ref 26.0–34.0)
MCHC: 31.2 g/dL (ref 30.0–36.0)
MCV: 82.9 fL (ref 80.0–100.0)
Monocytes Absolute: 0.5 10*3/uL (ref 0.1–1.0)
Monocytes Relative: 7 %
Neutro Abs: 5.3 10*3/uL (ref 1.7–7.7)
Neutrophils Relative %: 76 %
Platelets: 241 10*3/uL (ref 150–400)
RBC: 5.03 MIL/uL (ref 4.22–5.81)
RDW: 13.7 % (ref 11.5–15.5)
WBC: 7 10*3/uL (ref 4.0–10.5)
nRBC: 0 % (ref 0.0–0.2)

## 2023-08-25 LAB — COMPREHENSIVE METABOLIC PANEL
ALT: 15 U/L (ref 0–44)
AST: 14 U/L — ABNORMAL LOW (ref 15–41)
Albumin: 3.4 g/dL — ABNORMAL LOW (ref 3.5–5.0)
Alkaline Phosphatase: 54 U/L (ref 38–126)
Anion gap: 10 (ref 5–15)
BUN: 9 mg/dL (ref 6–20)
CO2: 24 mmol/L (ref 22–32)
Calcium: 9.1 mg/dL (ref 8.9–10.3)
Chloride: 109 mmol/L (ref 98–111)
Creatinine, Ser: 1.11 mg/dL (ref 0.61–1.24)
GFR, Estimated: >60 mL/min (ref >60)
Glucose, Bld: 101 mg/dL — ABNORMAL HIGH (ref 70–99)
Potassium: 3.7 mmol/L (ref 3.5–5.1)
Sodium: 143 mmol/L (ref 135–145)
Total Bilirubin: 1.3 mg/dL — ABNORMAL HIGH (ref 0.0–1.2)
Total Protein: 6.8 g/dL (ref 6.5–8.1)

## 2023-08-25 LAB — TROPONIN I (HIGH SENSITIVITY): Troponin I (High Sensitivity): 4 ng/L (ref <18)

## 2023-08-25 LAB — RESP PANEL BY RT-PCR (RSV, FLU A&B, COVID)  RVPGX2
Influenza A by PCR: NEGATIVE
Influenza B by PCR: NEGATIVE
Resp Syncytial Virus by PCR: NEGATIVE
SARS Coronavirus 2 by RT PCR: NEGATIVE

## 2023-08-25 LAB — D-DIMER, QUANTITATIVE: D-Dimer, Quant: 0.66 ug{FEU}/mL — ABNORMAL HIGH (ref 0.00–0.50)

## 2023-08-25 MED ORDER — AZITHROMYCIN 250 MG PO TABS: 250.0000 mg | ORAL_TABLET | Freq: Every day | ORAL | 0 refills | Status: AC

## 2023-08-25 MED ORDER — AMOXICILLIN 500 MG PO CAPS: 1000.0000 mg | ORAL_CAPSULE | Freq: Three times a day (TID) | ORAL | 0 refills | Status: AC

## 2023-08-25 MED ORDER — SODIUM CHLORIDE 0.9 % IV SOLN
1.0000 g | Freq: Once | INTRAVENOUS | Status: AC
  Administered 2023-08-25: 1 g via INTRAVENOUS
  Filled 2023-08-25: qty 10

## 2023-08-25 MED ORDER — LIDOCAINE 5 % EX PTCH
1.0000 | MEDICATED_PATCH | CUTANEOUS | Status: DC
  Administered 2023-08-25: 1 via TRANSDERMAL
  Filled 2023-08-25: qty 1

## 2023-08-25 MED ORDER — KETOROLAC TROMETHAMINE 15 MG/ML IJ SOLN
15.0000 mg | Freq: Once | INTRAMUSCULAR | Status: AC
  Administered 2023-08-25: 15 mg via INTRAVENOUS
  Filled 2023-08-25: qty 1

## 2023-08-25 MED ORDER — AZITHROMYCIN 250 MG PO TABS
500.0000 mg | ORAL_TABLET | Freq: Once | ORAL | Status: AC
  Administered 2023-08-25: 500 mg via ORAL
  Filled 2023-08-25: qty 2

## 2023-08-25 MED ORDER — IOHEXOL 350 MG/ML SOLN
75.0000 mL | Freq: Once | INTRAVENOUS | Status: AC | PRN
  Administered 2023-08-25: 75 mL via INTRAVENOUS

## 2023-08-25 NOTE — ED Triage Notes (Signed)
Patient reports right sided chest pain with inspiration and cough x 3 days. Denies SOB. States this feels similar to when he had pneumonia.

## 2023-08-25 NOTE — Discharge Instructions (Addendum)
You have pneumonia please take the antibiotics as prescribed.  Return to the ER if you feel like your symptoms are worsening.  Make sure you are taking deep breaths, and return if you feel like your oxygen is less than 90%, or you are severely short of breath.  Your symptoms should improve within the next 24 to 48 hours, but will take 5 to 7 days to completely resolve.  Antibiotic.

## 2023-08-25 NOTE — ED Notes (Signed)
Discharge after antibiotics finish

## 2023-08-25 NOTE — ED Provider Notes (Signed)
[place] EMERGENCY DEPARTMENT AT [facility] Provider Note   CSN: [id number] Arrival date & time: 08/25/23  7829     History  Chief Complaint  Patient presents with   Chest Pain    Jacob Freeman is a 53 y.o. male, hx of HTN, who presents to the ED 2/2 to R sided pleuritic chest pain that started on 1/25.  He does so that the pain feels similar to his past pneumonia, however he is not sure if that is what is going on.  He states that he started having a productive cough, on 1/25 as well, and has tried cough drops without relief.  Also notes that he has felt warm, and had some nausea.  Denies any vomiting, diarrhea, shortness of breath.  States the pain is an 8 out of 10, improved with Tylenol.  Denies any recent surgeries, injuries, history of blood clot.  Home Medications Prior to Admission medications   Medication Sig Start Date End Date Taking? Authorizing Provider  amoxicillin (AMOXIL) 500 MG capsule Take 2 capsules (1,000 mg total) by mouth 3 (three) times daily for 7 days. 08/25/23 09/01/23 Yes Hudsyn Barich L, PA  azithromycin (ZITHROMAX) 250 MG tablet Take 1 tablet (250 mg total) by mouth daily for 4 days. 08/25/23 08/29/23 Yes Siddh Vandeventer L, PA  acetaminophen (TYLENOL) 500 MG tablet Take 500 mg by mouth every 6 (six) hours as needed for mild pain.    [provider]  amLODipine (NORVASC) 10 MG tablet Take 1 tablet by mouth once daily 07/17/23   Ivonne Andrew, NP  diclofenac (VOLTAREN) 75 MG EC tablet Take 1 tablet (75 mg total) by mouth 2 (two) times daily. Patient not taking: Reported on 04/30/2023 10/23/22   Ivonne Andrew, NP  meloxicam (MOBIC) 15 MG tablet Take 1 tablet by mouth daily. Patient not taking: Reported on 06/25/2021 02/07/20   [provider]  rosuvastatin (CRESTOR) 10 MG tablet Take 1 tablet (10 mg total) by mouth daily. Patient not taking: Reported on 10/23/2022 08/24/19   Kallie Locks, FNP  tiZANidine (ZANAFLEX) 4 MG tablet Take  1 tablet by mouth every 8 (eight) hours as needed. Patient not taking: Reported on 04/24/2022 02/07/20   [provider]      Allergies    Patient has no known allergies.    Review of Systems   Review of Systems  Cardiovascular:  Positive for chest pain.    Physical Exam Updated Vital Signs BP (!) 149/88   Pulse 87   Temp 98.4 F (36.9 C) (Oral)   Resp 16   Ht 5\' 11"  (1.803 m)   Wt 117.9 kg   SpO2 98%   BMI 36.26 kg/m  Physical Exam Vitals and nursing note reviewed.  Constitutional:      General: He is not in acute distress.    Appearance: He is well-developed.  HENT:     Head: Normocephalic and atraumatic.  Eyes:     Conjunctiva/sclera: Conjunctivae normal.  Cardiovascular:     Rate and Rhythm: Normal rate and regular rhythm.     Heart sounds: No murmur heard. Pulmonary:     Effort: Pulmonary effort is normal. No respiratory distress.     Breath sounds: Normal breath sounds.  Abdominal:     Palpations: Abdomen is soft.     Tenderness: There is no abdominal tenderness.  Musculoskeletal:        General: No swelling.     Cervical back: Neck supple.  Skin:    General: Skin is warm and dry.     Capillary Refill: Capillary refill takes less than 2 seconds.  Neurological:     Mental Status: He is alert.  Psychiatric:        Mood and Affect: Mood normal.     ED Results / Procedures / Treatments   Labs (all labs ordered are listed, but only abnormal results are displayed) Labs Reviewed  CBC WITH DIFFERENTIAL/PLATELET - Abnormal; Notable for the following components:      Result Value   MCH 25.8 (*)    All other components within normal limits  COMPREHENSIVE METABOLIC PANEL - Abnormal; Notable for the following components:   Glucose, Bld 101 (*)    Albumin 3.4 (*)    AST 14 (*)    Total Bilirubin 1.3 (*)    All other components within normal limits  D-DIMER, QUANTITATIVE - Abnormal; Notable for the following components:   D-Dimer, Quant 0.66 (*)     All other components within normal limits  RESP PANEL BY RT-PCR (RSV, FLU A&B, COVID)  [id number]  TROPONIN I (HIGH SENSITIVITY)    EKG None  Radiology CT Angio Chest PE W and/or Wo Contrast Result Date: 08/25/2023 CLINICAL DATA:  Pulmonary embolism (PE) suspected, low to intermediate prob, positive D-dimer EXAM: CT ANGIOGRAPHY CHEST WITH CONTRAST TECHNIQUE: Multidetector CT imaging of the chest was performed using the standard protocol during bolus administration of intravenous contrast. Multiplanar CT image reconstructions and MIPs were obtained to evaluate the vascular anatomy. RADIATION DOSE REDUCTION: This exam was performed according to the departmental dose-optimization program which includes automated exposure control, adjustment of the mA and/or kV according to patient size and/or use of iterative reconstruction technique. CONTRAST:  75mL OMNIPAQUE IOHEXOL 350 MG/ML SOLN COMPARISON:  Chest XR, earlier same day.  CT AP, 09/21/2018. FINDINGS: Cardiovascular: Satisfactory opacification of the pulmonary arteries to the segmental level. No segmental or larger pulmonary embolus. Dilated main PA, measuring 3.5 cm. Normal heart size. No pericardial effusion. Mediastinum/Nodes: No enlarged mediastinal, hilar, or axillary lymph nodes. Thyroid gland, trachea, and esophagus demonstrate no significant findings. Lungs/Pleura: Hypoinflated lungs with tree-in-bud nodularities and ground-glass opacities involving the anterior basilar RIGHT lower lobe. Clear LEFT lung. No pleural effusion or pneumothorax. Upper Abdomen: No acute abnormality. Musculoskeletal: No acute chest wall abnormality. Chronic-appearing LEFT 4/5 posterior rib deformity. No acute osseous findings. Review of the MIP images confirms the above findings. IMPRESSION: 1. No acute vascular abnormality within the chest. Specifically, no segmental or larger pulmonary embolus. 2. RIGHT upper lobe findings, favored consistent with pneumonia. Consider  follow-up imaging after treatment to ensure resolution. 3. Main pulmonary artery dilation, likely reflecting underlying pulmonary arterial hypertension. Electronically Signed   By: Roanna Banning M.D.   On: 08/25/2023 11:43   DG Chest 2 View Result Date: 08/25/2023 CLINICAL DATA:  Right-sided chest pain and cough. EXAM: CHEST - 2 VIEW COMPARISON:  Chest radiograph dated October 17, 2017. FINDINGS: Low lung volumes. The heart size and mediastinal contours are within normal limits. No focal consolidation, pleural effusion, or pneumothorax. No acute osseous abnormality. IMPRESSION: Low lung volumes.  No acute cardiopulmonary findings. Electronically Signed   By: Hart Robinsons M.D.   On: 08/25/2023 10:17    Procedures Procedures    Medications Ordered in ED Medications  lidocaine (LIDODERM) 5 % 1 patch (1 patch Transdermal Patch Applied 08/25/23 0949)  cefTRIAXone (ROCEPHIN) 1 g in sodium chloride 0.9 % 100 mL IVPB (1 g Intravenous New Bag/Given 08/25/23  1154)  azithromycin (ZITHROMAX) tablet 500 mg (has no administration in time range)  ketorolac (TORADOL) 15 MG/ML injection 15 mg (15 mg Intravenous Given 08/25/23 0949)  iohexol (OMNIPAQUE) 350 MG/ML injection 75 mL (75 mLs Intravenous Contrast Given 08/25/23 1120)    ED Course/ Medical Decision Making/ A&P             HEART Score: 3                    Medical Decision Making Patient is a 53 year old male, here for pleuritic chest pain, this been going on for the last couple days.  States it feels like last time he had pneumonia will start with a chest x-ray, COVID/flu given cough, for further evaluation as well as standing labs, D-dimer as he is PERC positive, and cannot be ruled out.  Troponin for chest pain.  Amount and/or Complexity of Data Reviewed Labs: ordered.    Details: D-dimer elevated at 0.66, no leukocytosis, troponin within normal limits Radiology: ordered.    Details: Chest x-ray clear, CTA PE study shows concern for right upper  lobe pneumonia. Discussion of management or test interpretation with external provider(s): Patient is a 53 year old male, here for right-sided chest pain, going on for the last couple of days.  He has a negative chest x-ray, but CTA PE study, was positive for concern for right upper lobe pneumonia.  He was given ceftriaxone, azithromycin.  Discharged on amoxicillin, azithromycin as he is less than 65, with very few medical issues.  Instructed on return precautions, and discharged home  Risk Prescription drug management.   Final Clinical Impression(s) / ED Diagnoses Final diagnoses:  Pneumonia of right upper lobe due to infectious organism    Rx / DC Orders ED Discharge Orders          Ordered    amoxicillin (AMOXIL) 500 MG capsule  3 times daily        08/25/23 1157    azithromycin (ZITHROMAX) 250 MG tablet  Daily        08/25/23 1157              Kess Mcilwain Elbert Ewings, PA 08/25/23 1205    Melene Plan, DO 08/25/23 1207

## 2023-08-26 ENCOUNTER — Telehealth: Payer: Self-pay

## 2023-08-26 NOTE — Telephone Encounter (Signed)
TOC call

## 2023-08-27 ENCOUNTER — Telehealth: Payer: Self-pay

## 2023-08-27 NOTE — Telephone Encounter (Signed)
Copied from CRM [id number]. Topic: General - Other >> Aug 27, 2023  8:36 AM Dimitri Ped wrote: Reason for CRM: patient called to schedule hospital follow up . I scheduled appointment and patient wanted to ask doctor should patient return to work today. Patient has a bad case of the runs since taking medicine he received from hospital call back number [phone]

## 2023-08-28 ENCOUNTER — Telehealth: Payer: Self-pay

## 2023-08-28 NOTE — Transitions of Care (Post Inpatient/ED Visit) (Signed)
   08/28/2023  Name: Jacob Freeman MRN: [id number] DOB: [birth year]  Today's TOC FU Call Status: Today's TOC FU Call Status:: Unsuccessful Call (2nd Attempt) Unsuccessful Call (2nd Attempt) Date: 08/28/23  Attempted to reach the patient regarding the most recent Inpatient/ED visit.  Follow Up Plan: Additional outreach attempts will be made to reach the patient to complete the Transitions of Care (Post Inpatient/ED visit) call.   Signature  [employer], [place]

## 2023-08-29 NOTE — Telephone Encounter (Signed)
Called pt to find out letter. No answer. LVM for a call back. KH

## 2023-09-02 NOTE — Telephone Encounter (Signed)
Lvm for pt to call back for letter. KH

## 2023-09-03 ENCOUNTER — Encounter: Payer: Self-pay | Admitting: Nurse Practitioner

## 2023-09-03 ENCOUNTER — Ambulatory Visit (INDEPENDENT_AMBULATORY_CARE_PROVIDER_SITE_OTHER): Payer: Medicaid Other | Admitting: Nurse Practitioner

## 2023-09-03 VITALS — BP 123/75 | HR 68 | Temp 97.9°F | Wt 260.2 lb

## 2023-09-03 DIAGNOSIS — Z8701 Personal history of pneumonia (recurrent): Secondary | ICD-10-CM | POA: Diagnosis not present

## 2023-09-03 DIAGNOSIS — I272 Pulmonary hypertension, unspecified: Secondary | ICD-10-CM

## 2023-09-03 MED ORDER — PREDNISONE 20 MG PO TABS: 20.0000 mg | ORAL_TABLET | Freq: Every day | ORAL | 0 refills | Status: AC

## 2023-09-03 NOTE — Patient Instructions (Addendum)
 1. Pulmonary hypertension (HCC) (Primary)  - Ambulatory referral to Pulmonology  2. History of pneumonia  - Ambulatory referral to Pulmonology - predniSONE  (DELTASONE ) 20 MG tablet; Take 1 tablet (20 mg total) by mouth daily with breakfast for 5 days.  Dispense: 5 tablet; Refill: 0  Follow up:  Follow up as schedule

## 2023-09-03 NOTE — Progress Notes (Signed)
 Subjective   Patient ID: Jacob Freeman, male    DOB: 03/19/1971, 53 y.o.   MRN: [id number]  Chief Complaint  Patient presents with   Hospitalization Follow-up    Referring provider: Oley Bascom RAMAN, NP  Jacob Freeman is a 53 y.o. male with Past Medical History: No date: Chest pain, unspecified No date: Esophageal reflux No date: Hematuria No date: Hypercholesteremia No date: Hypertension No date: Nephrolithiasis No date: Pain in joint, hand 07/2019: Prediabetes 01/2019: Vitamin D  deficiency 02/2020: Vitamin D  deficiency   HPI  Patient presents today for a ED follow-up.  He was seen in the ED on 08/25/2023 and was diagnosed with pneumonia of the right upper lobe.  He was treated with Augmentin  and azithromycin .  He has finished these medications and is feeling and improvement but is still very fatigued.  He does have slight cough continuing.  We will trial a round of prednisone .  CTA did show main pulmonary artery dilation likely reflecting underlying pulmonary arterial hypertension.  We will place referral to pulmonary for further evaluation. Denies f/c/s, n/v/d, hemoptysis, PND, leg swelling Denies chest pain or edema      No Known Allergies  Immunization History  Administered Date(s) Administered   Influenza,inj,Quad PF,6+ Mos 08/26/2018   Tdap 02/05/2019    Tobacco History: Social History   Tobacco Use  Smoking Status Never  Smokeless Tobacco Never   Counseling given: Not Answered   Outpatient Encounter Medications as of 09/03/2023  Medication Sig   acetaminophen  (TYLENOL ) 500 MG tablet Take 500 mg by mouth every 6 (six) hours as needed for mild pain.   amLODipine  (NORVASC ) 10 MG tablet Take 1 tablet by mouth once daily   predniSONE  (DELTASONE ) 20 MG tablet Take 1 tablet (20 mg total) by mouth daily with breakfast for 5 days.   diclofenac  (VOLTAREN ) 75 MG EC tablet Take 1 tablet (75 mg total) by mouth 2 (two) times daily. (Patient not taking: Reported on  09/03/2023)   meloxicam (MOBIC) 15 MG tablet Take 1 tablet by mouth daily. (Patient not taking: Reported on 09/03/2023)   rosuvastatin  (CRESTOR ) 10 MG tablet Take 1 tablet (10 mg total) by mouth daily. (Patient not taking: Reported on 09/03/2023)   tiZANidine (ZANAFLEX) 4 MG tablet Take 1 tablet by mouth every 8 (eight) hours as needed. (Patient not taking: Reported on 09/03/2023)   No facility-administered encounter medications on file as of 09/03/2023.    Review of Systems  Review of Systems  Constitutional: Negative.   HENT: Negative.    Cardiovascular: Negative.   Gastrointestinal: Negative.   Allergic/Immunologic: Negative.   Neurological: Negative.   Psychiatric/Behavioral: Negative.       Objective:   BP 123/75   Pulse 68   Temp 97.9 F (36.6 C)   Wt 260 lb 3.2 oz (118 kg)   SpO2 96%   BMI 36.29 kg/m   Wt Readings from Last 5 Encounters:  09/03/23 260 lb 3.2 oz (118 kg)  08/25/23 260 lb (117.9 kg)  05/16/23 270 lb (122.5 kg)  04/30/23 265 lb (120.2 kg)  10/23/22 277 lb 6.4 oz (125.8 kg)     Physical Exam Vitals and nursing note reviewed.  Constitutional:      General: He is not in acute distress.    Appearance: He is well-developed.  Cardiovascular:     Rate and Rhythm: Normal rate and regular rhythm.  Pulmonary:     Effort: Pulmonary effort is normal.     Breath sounds: Normal breath sounds.  Skin:    General: Skin is warm and dry.  Neurological:     Mental Status: He is alert and oriented to person, place, and time.       Assessment & Plan:   Pulmonary hypertension (HCC) -     Ambulatory referral to Pulmonology  History of pneumonia -     Ambulatory referral to Pulmonology -     predniSONE ; Take 1 tablet (20 mg total) by mouth daily with breakfast for 5 days.  Dispense: 5 tablet; Refill: 0     Return in about 3 months (around 12/01/2023).   Bascom GORMAN Borer, NP 09/03/2023

## 2023-09-04 NOTE — Telephone Encounter (Signed)
 Pt had a note from ed to return to [facility] 08/27/23 . No reply from pt call. kh

## 2023-09-23 ENCOUNTER — Other Ambulatory Visit: Payer: Self-pay

## 2023-09-23 ENCOUNTER — Emergency Department (HOSPITAL_COMMUNITY): Payer: Medicaid Other

## 2023-09-23 ENCOUNTER — Emergency Department (HOSPITAL_COMMUNITY)
Admission: EM | Admit: 2023-09-23 | Discharge: 2023-09-24 | Disposition: A | Discharge status: Home / Self Care | Payer: Medicaid Other | Attending: Emergency Medicine | Admitting: Emergency Medicine

## 2023-09-23 ENCOUNTER — Encounter (HOSPITAL_COMMUNITY): Payer: Self-pay

## 2023-09-23 DIAGNOSIS — Z79899 Other long term (current) drug therapy: Secondary | ICD-10-CM | POA: Insufficient documentation

## 2023-09-23 DIAGNOSIS — I10 Essential (primary) hypertension: Secondary | ICD-10-CM | POA: Diagnosis not present

## 2023-09-23 DIAGNOSIS — E860 Dehydration: Secondary | ICD-10-CM | POA: Insufficient documentation

## 2023-09-23 DIAGNOSIS — E876 Hypokalemia: Secondary | ICD-10-CM | POA: Diagnosis not present

## 2023-09-23 DIAGNOSIS — R55 Syncope and collapse: Secondary | ICD-10-CM | POA: Diagnosis present

## 2023-09-23 LAB — CBC WITH DIFFERENTIAL/PLATELET
Abs Immature Granulocytes: 0.03 10*3/uL (ref 0.00–0.07)
Basophils Absolute: 0 10*3/uL (ref 0.0–0.1)
Basophils Relative: 1 %
Eosinophils Absolute: 0.2 10*3/uL (ref 0.0–0.5)
Eosinophils Relative: 3 %
HCT: 48.9 % (ref 39.0–52.0)
Hemoglobin: 15.5 g/dL (ref 13.0–17.0)
Immature Granulocytes: 1 %
Lymphocytes Relative: 19 %
Lymphs Abs: 1 10*3/uL (ref 0.7–4.0)
MCH: 26.1 pg (ref 26.0–34.0)
MCHC: 31.7 g/dL (ref 30.0–36.0)
MCV: 82.3 fL (ref 80.0–100.0)
Monocytes Absolute: 0.3 10*3/uL (ref 0.1–1.0)
Monocytes Relative: 5 %
Neutro Abs: 3.6 10*3/uL (ref 1.7–7.7)
Neutrophils Relative %: 71 %
Platelets: 231 10*3/uL (ref 150–400)
RBC: 5.94 MIL/uL — ABNORMAL HIGH (ref 4.22–5.81)
RDW: 14.8 % (ref 11.5–15.5)
WBC: 5.1 10*3/uL (ref 4.0–10.5)
nRBC: 0 % (ref 0.0–0.2)

## 2023-09-23 LAB — TROPONIN I (HIGH SENSITIVITY)
Troponin I (High Sensitivity): 4 ng/L (ref <18)
Troponin I (High Sensitivity): 5 ng/L (ref <18)

## 2023-09-23 LAB — BASIC METABOLIC PANEL
Anion gap: 10 (ref 5–15)
BUN: 5 mg/dL — ABNORMAL LOW (ref 6–20)
CO2: 23 mmol/L (ref 22–32)
Calcium: 8.3 mg/dL — ABNORMAL LOW (ref 8.9–10.3)
Chloride: 107 mmol/L (ref 98–111)
Creatinine, Ser: 1.28 mg/dL — ABNORMAL HIGH (ref 0.61–1.24)
GFR, Estimated: >60 mL/min (ref >60)
Glucose, Bld: 156 mg/dL — ABNORMAL HIGH (ref 70–99)
Potassium: 3.4 mmol/L — ABNORMAL LOW (ref 3.5–5.1)
Sodium: 140 mmol/L (ref 135–145)

## 2023-09-23 LAB — URINALYSIS, ROUTINE W REFLEX MICROSCOPIC
Bacteria, UA: NONE SEEN
Bilirubin Urine: NEGATIVE
Glucose, UA: NEGATIVE mg/dL
Hgb urine dipstick: NEGATIVE
Ketones, ur: NEGATIVE mg/dL
Leukocytes,Ua: NEGATIVE
Nitrite: NEGATIVE
Protein, ur: NEGATIVE mg/dL
Specific Gravity, Urine: 1.009 (ref 1.005–1.030)
pH: 7 (ref 5.0–8.0)

## 2023-09-23 MED ORDER — SODIUM CHLORIDE 0.9 % IV BOLUS
500.0000 mL | Freq: Once | INTRAVENOUS | Status: AC
  Administered 2023-09-23: 500 mL via INTRAVENOUS

## 2023-09-23 NOTE — ED Triage Notes (Signed)
 Pt here for a near syncope event. Pt was walking kids home from school and felt weak/collapsed. Pt donated plasma today and has before in past. Pt received 500cc of fluid. Axox4.

## 2023-09-23 NOTE — ED Provider Triage Note (Signed)
 Emergency Medicine Provider Triage Evaluation Note  Darrell Hauk , a 53 y.o. male  was evaluated in triage.  Pt complains of passing out after giving plasma today. Fell backwards hit back and maybe head. Reports headache and very sleepy now.  Review of Systems  Positive: nausea Negative: Chest pain  Physical Exam  BP 117/82 (BP Location: Left Arm)   Pulse 72   Temp 98.7 F (37.1 C) (Oral)   Resp 16   Ht 5\' 11"  (1.803 m)   Wt 117 kg   SpO2 98%   BMI 35.98 kg/m  Gen:   Awake, no distress  tired appearance Resp:  Normal effort CTA MSK:   Moves extremities without difficulty. No focal deficit Other:  No scalp hematoma, no extremity deformity  Medical Decision Making  Medically screening exam initiated at 3:30 PM.  Appropriate orders placed.  Davari Lopes was informed that the remainder of the evaluation will be completed by another provider, this initial triage assessment does not replace that evaluation, and the importance of remaining in the ED until their evaluation is complete.     Arby Barrette, MD 09/23/23 1535

## 2023-09-24 ENCOUNTER — Emergency Department (HOSPITAL_COMMUNITY): Payer: Medicaid Other

## 2023-09-24 MED ORDER — LACTATED RINGERS BOLUS PEDS: 1000.0000 mL | Freq: Once | INTRAVENOUS | Status: DC

## 2023-09-24 MED ORDER — POTASSIUM CHLORIDE CRYS ER 20 MEQ PO TBCR
20.0000 meq | EXTENDED_RELEASE_TABLET | Freq: Once | ORAL | Status: AC
  Administered 2023-09-24: 20 meq via ORAL
  Filled 2023-09-24: qty 1

## 2023-09-24 MED ORDER — LACTATED RINGERS IV BOLUS
1000.0000 mL | Freq: Once | INTRAVENOUS | Status: AC
  Administered 2023-09-24: 1000 mL via INTRAVENOUS

## 2023-09-24 NOTE — ED Provider Notes (Signed)
 [place] EMERGENCY DEPARTMENT AT [facility] Provider Note   CSN: [id number] Arrival date & time: 09/23/23  1515     History  Chief Complaint  Patient presents with   Near Syncope    Jacob Freeman is a 53 y.o. male.  The history is provided by the patient.  Near Syncope  Jacob Freeman is a 53 y.o. male who presents to the Emergency Department complaining of near syncope.  He presents to the emergency department following a near syncopal event that occurred earlier today.  In the morning he gave plasma and then went with his grandchildren from school.  He states that when he was walking them across the street his legs became weak and buckled and he collapsed to the ground.  He is unsure if he lost consciousness but thinks things may have gotten black for second.  He states that since then he has experienced some tingling in bilateral hands.  Overall the tingling in his hands is improving.  No neck pain.  He had brief headache earlier but this is now resolved.  No chest pain, abdominal pain, fevers, shortness of breath.  He has a history of hypertension, no additional medical problems.      Home Medications Prior to Admission medications   Medication Sig Start Date End Date Taking? Authorizing Provider  acetaminophen (TYLENOL) 500 MG tablet Take 500 mg by mouth every 6 (six) hours as needed for mild pain.    [provider]  amLODipine (NORVASC) 10 MG tablet Take 1 tablet by mouth once daily 07/17/23   Ivonne Andrew, NP  diclofenac (VOLTAREN) 75 MG EC tablet Take 1 tablet (75 mg total) by mouth 2 (two) times daily. Patient not taking: Reported on 09/03/2023 10/23/22   Ivonne Andrew, NP  meloxicam (MOBIC) 15 MG tablet Take 1 tablet by mouth daily. Patient not taking: Reported on 09/03/2023 02/07/20   [provider]  rosuvastatin (CRESTOR) 10 MG tablet Take 1 tablet (10 mg total) by mouth daily. Patient not taking: Reported on 09/03/2023 08/24/19   Kallie Locks, FNP  tiZANidine (ZANAFLEX) 4 MG tablet Take 1 tablet by mouth every 8 (eight) hours as needed. Patient not taking: Reported on 09/03/2023 02/07/20   [provider]      Allergies    Patient has no known allergies.    Review of Systems   Review of Systems  Cardiovascular:  Positive for near-syncope.  All other systems reviewed and are negative.   Physical Exam Updated Vital Signs BP 122/85 (BP Location: Left Arm)   Pulse 68   Temp 97.9 F (36.6 C) (Oral)   Resp 18   Ht 5\' 11"  (1.803 m)   Wt 117 kg   SpO2 100%   BMI 35.98 kg/m  Physical Exam Vitals and nursing note reviewed.  Constitutional:      Appearance: He is well-developed.  HENT:     Head: Normocephalic and atraumatic.  Cardiovascular:     Rate and Rhythm: Normal rate and regular rhythm.     Heart sounds: No murmur heard. Pulmonary:     Effort: Pulmonary effort is normal. No respiratory distress.     Breath sounds: Normal breath sounds.  Abdominal:     Palpations: Abdomen is soft.     Tenderness: There is no abdominal tenderness. There is no guarding or rebound.  Musculoskeletal:        General: No tenderness.  Skin:    General: Skin is warm and  dry.  Neurological:     Mental Status: He is alert and oriented to person, place, and time.     Comments: 5 out of 5 strength in all 4 extremities with sensation to light touch intact in all 4 extremities  Psychiatric:        Behavior: Behavior normal.     ED Results / Procedures / Treatments   Labs (all labs ordered are listed, but only abnormal results are displayed) Labs Reviewed  BASIC METABOLIC PANEL - Abnormal; Notable for the following components:      Result Value   Potassium 3.4 (*)    Glucose, Bld 156 (*)    BUN 5 (*)    Creatinine, Ser 1.28 (*)    Calcium 8.3 (*)    All other components within normal limits  CBC WITH DIFFERENTIAL/PLATELET - Abnormal; Notable for the following components:   RBC 5.94 (*)    All other components  within normal limits  URINALYSIS, ROUTINE W REFLEX MICROSCOPIC  TROPONIN I (HIGH SENSITIVITY)  TROPONIN I (HIGH SENSITIVITY)    EKG None  Radiology CT Cervical Spine Wo Contrast Result Date: 09/24/2023 CLINICAL DATA:  Neck trauma with dangerous injury mechanism. Syncope and collapse after giving plasma. EXAM: CT CERVICAL SPINE WITHOUT CONTRAST TECHNIQUE: Multidetector CT imaging of the cervical spine was performed without intravenous contrast. Multiplanar CT image reconstructions were also generated. RADIATION DOSE REDUCTION: This exam was performed according to the departmental dose-optimization program which includes automated exposure control, adjustment of the mA and/or kV according to patient size and/or use of iterative reconstruction technique. COMPARISON:  11/24/2017 FINDINGS: Alignment: No traumatic malalignment. Straightening of cervical lordosis. Skull base and vertebrae: No acute fracture. No primary bone lesion or focal pathologic process. Soft tissues and spinal canal: No prevertebral fluid or swelling. No visible canal hematoma. Disc levels: C3-4 and C4-5 predominant disc space narrowing and ridging. Upper chest: Clear apical lungs IMPRESSION: No evidence of acute injury. Electronically Signed   By: Tiburcio Pea M.D.   On: 09/24/2023 05:40   CT Head Wo Contrast Result Date: 09/23/2023 CLINICAL DATA:  Altered level of consciousness, head trauma EXAM: CT HEAD WITHOUT CONTRAST TECHNIQUE: Contiguous axial images were obtained from the base of the skull through the vertex without intravenous contrast. RADIATION DOSE REDUCTION: This exam was performed according to the departmental dose-optimization program which includes automated exposure control, adjustment of the mA and/or kV according to patient size and/or use of iterative reconstruction technique. COMPARISON:  10/06/2017 FINDINGS: Brain: No acute infarct or hemorrhage. Lateral ventricles and midline structures are unremarkable. No  acute extra-axial fluid collections. No mass effect. Vascular: No hyperdense vessel or unexpected calcification. Skull: Normal. Negative for fracture or focal lesion. Sinuses/Orbits: No acute finding. Other: None. IMPRESSION: 1. Stable head CT, no acute intracranial process. Electronically Signed   By: Sharlet Salina M.D.   On: 09/23/2023 17:04    Procedures Procedures    Medications Ordered in ED Medications  sodium chloride 0.9 % bolus 500 mL (0 mLs Intravenous Stopped 09/24/23 0454)  potassium chloride SA (KLOR-CON M) CR tablet 20 mEq (20 mEq Oral Given 09/24/23 0536)  lactated ringers bolus 1,000 mL (0 mLs Intravenous Stopped 09/24/23 4401)    ED Course/ Medical Decision Making/ A&P                                 Medical Decision Making Amount and/or Complexity of Data Reviewed Radiology: ordered.  Risk Prescription drug management.   Patient with history of hypertension here for evaluation of near syncope after he donated plasma.  He has no focal deficits on examination.  He did have paresthesias to bilateral hands.  CT head and C-spine are negative for acute abnormality.  He has mild elevation in his creatinine, mild hypokalemia.  He was treated with oral potassium, IV fluids.  Blood pressures have remained stable during his ED stay.  He has had prior abdominal imaging, current clinical picture is not consistent with AAA.  No evidence of significant anemia.  No evidence of sepsis.  Discussed with patient home care for near syncope.  Discussed close PCP follow-up as well as return precautions.        Final Clinical Impression(s) / ED Diagnoses Final diagnoses:  Near syncope  Dehydration    Rx / DC Orders ED Discharge Orders     None         Tilden Fossa, MD 09/24/23 [phone]

## 2023-09-24 NOTE — ED Notes (Signed)
 Patient ambulated with no assistance or issues.

## 2023-09-25 ENCOUNTER — Telehealth: Payer: Self-pay

## 2023-09-25 ENCOUNTER — Ambulatory Visit: Payer: Self-pay | Admitting: Nurse Practitioner

## 2023-09-25 NOTE — Telephone Encounter (Signed)
 Copied from CRM [id number]. Topic: Clinical - Medical Advice >> Sep 25, 2023  5:00 PM Higinio Roger wrote: Reason for CRM: Stomach is quesy, Feeling light, dizzy, sleeping more than usual. Has been drinking a lot of water. Only ate twice on 09/24/23 and ate once today, 09/25/23. Patient has work in the morning and is not feeling like himself to go. Patient is trying to see if he should go to work.   Chief Complaint: Nausea  Symptoms: Nausea  Frequency: Constant  Pertinent Negatives: Patient denies vomiting  Disposition: [] ED /[] Urgent Care (no appt availability in office) / [] Appointment(In office/virtual)/ []  [place]/ [x] Home Care/ [] Refused Recommended Disposition /[] [place] [facility]/ []  Follow-up with PCP Additional Notes: Patient called to report he is experiencing nausea since this morning. He states he has not had any vomiting. He states he has also been sleeping more than usual and wants to know if he should go to work. I advised that I could not advise him whether or not he should go to work and offered to schedule an appointment for him. He states he is not able to make it to the available office appointment, and will instead go to urgent care or the ED if needed.      Reason for Disposition  Unexplained nausea  Answer Assessment - Initial Assessment Questions 1. NAUSEA SEVERITY: "How bad is the nausea?" (e.g., mild, moderate, severe; dehydration, weight loss)   - MILD: loss of appetite without change in eating habits   - MODERATE: decreased oral intake without significant weight loss, dehydration, or malnutrition   - SEVERE: inadequate caloric or fluid intake, significant weight loss, symptoms of dehydration     Moderate  2. ONSET: "When did the nausea begin?"     Today 3. VOMITING: "Any vomiting?" If Yes, ask: "How many times today?"     No 4. RECURRENT SYMPTOM: "Have you had nausea before?" If Yes, ask: "When was the last time?" "What happened that time?"      No 5. CAUSE: "What do you think is causing the nausea?"     Unsure  Protocols used: Nausea-A-AH

## 2023-09-25 NOTE — Transitions of Care (Post Inpatient/ED Visit) (Signed)
   09/25/2023  Name: Jacob Freeman MRN: [id number] DOB: 03/25/1971  Today's TOC FU Call Status: Today's TOC FU Call Status:: Unsuccessful Call (1st Attempt) Unsuccessful Call (1st Attempt) Date: 09/25/23  Attempted to reach the patient regarding the most recent Inpatient/ED visit.  Follow Up Plan: Additional outreach attempts will be made to reach the patient to complete the Transitions of Care (Post Inpatient/ED visit) call.   Signature Renelda Loma RMA

## 2023-09-26 ENCOUNTER — Ambulatory Visit: Payer: Medicaid Other | Admitting: Internal Medicine

## 2023-09-26 ENCOUNTER — Encounter (HOSPITAL_COMMUNITY): Payer: Self-pay

## 2023-09-26 ENCOUNTER — Ambulatory Visit (HOSPITAL_COMMUNITY)
Admission: EM | Admit: 2023-09-26 | Discharge: 2023-09-26 | Disposition: A | Discharge status: Home / Self Care | Payer: Medicaid Other | Attending: Emergency Medicine | Admitting: Emergency Medicine

## 2023-09-26 DIAGNOSIS — R42 Dizziness and giddiness: Secondary | ICD-10-CM | POA: Diagnosis not present

## 2023-09-26 DIAGNOSIS — R110 Nausea: Secondary | ICD-10-CM | POA: Diagnosis not present

## 2023-09-26 LAB — POC COVID19/FLU A&B COMBO
Covid Antigen, POC: NEGATIVE
Influenza A Antigen, POC: NEGATIVE
Influenza B Antigen, POC: NEGATIVE

## 2023-09-26 LAB — POCT FASTING CBG KUC MANUAL ENTRY: POCT Glucose (KUC): 87 mg/dL (ref 70–99)

## 2023-09-26 MED ORDER — ONDANSETRON 4 MG PO TBDP
4.0000 mg | ORAL_TABLET | Freq: Once | ORAL | Status: AC
  Administered 2023-09-26: 4 mg via ORAL

## 2023-09-26 MED ORDER — ONDANSETRON 4 MG PO TBDP: 4.0000 mg | ORAL_TABLET | Freq: Three times a day (TID) | ORAL | 0 refills | Status: AC | PRN

## 2023-09-26 MED ORDER — ONDANSETRON 4 MG PO TBDP
ORAL_TABLET | ORAL | Status: AC
  Filled 2023-09-26: qty 1

## 2023-09-26 NOTE — ED Triage Notes (Signed)
 Chief Complaint: nausea and dizziness. No emesis or diarrhea, denies all other sick symptoms. No abdominal pain   Sick exposure: No  Onset: 3 days   Prescriptions or OTC medications tried: No    New foods, medications, or products: No  Recent Travel: No

## 2023-09-26 NOTE — Discharge Instructions (Addendum)
 Use the nausea medicine every 8 hours as needed.  Ensure you are staying well-hydrated with at least 64 ounces of fluid.  You can follow a bland diet such as bananas, rice, toast, applesauce, soup and broth.  Return to clinic for any new or urgent symptoms.  Seek immediate care at the nearest emergency department if you develop any further syncope.

## 2023-09-26 NOTE — ED Provider Notes (Signed)
 [facility]    CSN: [id number] Arrival date & time: 09/26/23  1459      History   Chief Complaint Chief Complaint  Patient presents with   Nausea    HPI Jacob Freeman is a 53 y.o. male.   Patient presents to clinic over concerns of intermittent dizziness and nausea with eating.  Has not had any emesis, diarrhea, fever, cough or congestion.  Has had generalized fatigued.   Was evaluated within the last 72 hours for similar presentation.  He had had a syncopal episode after donating blood.  Had unremarkable labs, C-spine, CT head and was given fluids for dehydration.  Had a normal bowel movement yesterday.  Did have pancakes this morning for breakfast.  Has not had lunch, overall diminished appetite.  Denies chest pain or shortness of breath.  No changes in symptoms, reports he was supposed to go into work today and was unable to do so.  No further syncopal episodes.  The history is provided by the patient and medical records.    Past Medical History:  Diagnosis Date   Chest pain, unspecified    Esophageal reflux    Hematuria    Hypercholesteremia    Hypertension    Nephrolithiasis    Pain in joint, hand    Prediabetes 07/2019   Vitamin D deficiency 01/2019   Vitamin D deficiency 02/2020    Patient Active Problem List   Diagnosis Date Noted   Laceration of left great toe with damage to nail, initial encounter 05/16/2023   Lumbar radiculopathy 10/24/2022   Prediabetes 08/24/2019   Shortness of breath 03/27/2019   Dehydration 03/27/2019   Constipation 02/05/2019   Nausea 02/05/2019   Strain of flank 09/23/2018   Muscle strain 09/23/2018   Flank pain 09/23/2018   Finger pain, left 09/11/2018   Skin infection 09/11/2018   Essential hypertension 08/26/2018   Class 2 severe obesity with serious comorbidity and body mass index (BMI) of 38.0 to 38.9 in adult [facility]) 08/26/2018    Past Surgical History:  Procedure Laterality Date   BACK SURGERY  06/2020    TONSILLECTOMY         Home Medications    Prior to Admission medications   Medication Sig Start Date End Date Taking? Authorizing Provider  acetaminophen (TYLENOL) 500 MG tablet Take 500 mg by mouth every 6 (six) hours as needed for mild pain.   Yes [provider]  amLODipine (NORVASC) 10 MG tablet Take 1 tablet by mouth once daily 07/17/23  Yes Ivonne Andrew, NP  ondansetron (ZOFRAN-ODT) 4 MG disintegrating tablet Take 1 tablet (4 mg total) by mouth every 8 (eight) hours as needed for nausea or vomiting. 09/26/23  Yes Roy Tokarz, [place] N, FNP    Family History Family History  Problem Relation Age of Onset   Brain cancer Father    Hypertension Mother     Social History Social History   Tobacco Use   Smoking status: Never   Smokeless tobacco: Never  Vaping Use   Vaping status: Never Used  Substance Use Topics   Alcohol use: No   Drug use: No     Allergies   Patient has no known allergies.   Review of Systems Review of Systems  Per HPI   Physical Exam Triage Vital Signs ED Triage Vitals  Encounter Vitals Group     BP 09/26/23 1636 124/86     Systolic BP Percentile --      Diastolic BP Percentile --  Pulse Rate 09/26/23 1636 63     Resp 09/26/23 1636 16     Temp 09/26/23 1636 98 F (36.7 C)     Temp Source 09/26/23 1636 Oral     SpO2 09/26/23 1636 96 %     Weight 09/26/23 1636 256 lb (116.1 kg)     Height 09/26/23 1636 5\' 11"  (1.803 m)     Head Circumference --      Peak Flow --      Pain Score 09/26/23 1635 0     Pain Loc --      Pain Education --      Exclude from Growth Chart --    No data found.  Updated Vital Signs BP 124/86 (BP Location: Left Arm)   Pulse 63   Temp 98 F (36.7 C) (Oral)   Resp 16   Ht 5\' 11"  (1.803 m)   Wt 256 lb (116.1 kg)   SpO2 96%   BMI 35.70 kg/m   Visual Acuity Right Eye Distance:   Left Eye Distance:   Bilateral Distance:    Right Eye Near:   Left Eye Near:    Bilateral Near:      Physical Exam Vitals and nursing note reviewed.  Constitutional:      Appearance: Normal appearance.  HENT:     Head: Normocephalic and atraumatic.     Right Ear: External ear normal.     Left Ear: External ear normal.     Nose: Nose normal.     Mouth/Throat:     Mouth: Mucous membranes are moist.  Eyes:     Conjunctiva/sclera: Conjunctivae normal.  Cardiovascular:     Rate and Rhythm: Normal rate and regular rhythm.     Heart sounds: Normal heart sounds. No murmur heard. Pulmonary:     Effort: Pulmonary effort is normal. No respiratory distress.     Breath sounds: Normal breath sounds.  Musculoskeletal:        General: Normal range of motion.  Skin:    General: Skin is warm and dry.  Neurological:     General: No focal deficit present.     Mental Status: He is alert and oriented to person, place, and time.  Psychiatric:        Mood and Affect: Mood normal.        Behavior: Behavior normal.      UC Treatments / Results  Labs (all labs ordered are listed, but only abnormal results are displayed) Labs Reviewed  POCT FASTING CBG K[facility] MANUAL ENTRY - Normal  POC COVID19/FLU A&B COMBO - Normal    EKG   Radiology No results found.  Procedures Procedures (including critical care time)  Medications Ordered in UC Medications  ondansetron (ZOFRAN-ODT) disintegrating tablet 4 mg (4 mg Oral Given 09/26/23 1712)    Initial Impression / Assessment and Plan / UC Course  I have reviewed the triage vital signs and the nursing notes.  Pertinent labs & imaging results that were available during my care of the patient were reviewed by me and considered in my medical decision making (see chart for details).  Vitals and triage reviewed, patient is hemodynamically stable.  Lungs are vesicular, heart with regular rate and rhythm.  No further syncopal episodes.  COVID and flu testing negative in clinic.  Nausea slightly improved with ODT Zofran.  Symptomatic management encouraged  for nausea and dizziness with reassuring unremarkable emergency department workup for similar symptoms within 72 hours.  Work note provided.  Plan of care, follow-up care return precautions given, no questions at this time.     Final Clinical Impressions(s) / UC Diagnoses   Final diagnoses:  Nausea  Dizziness     Discharge Instructions      Use the nausea medicine every 8 hours as needed.  Ensure you are staying well-hydrated with at least 64 ounces of fluid.  You can follow a bland diet such as bananas, rice, toast, applesauce, soup and broth.  Return to clinic for any new or urgent symptoms.  Seek immediate care at the nearest emergency department if you develop any further syncope.     ED Prescriptions     Medication Sig Dispense Auth. Provider   ondansetron (ZOFRAN-ODT) 4 MG disintegrating tablet Take 1 tablet (4 mg total) by mouth every 8 (eight) hours as needed for nausea or vomiting. 20 tablet Lasonya Hubner, [place] N, [place]      PDMP not reviewed this encounter.   Asja Frommer, [place] N, [place] 09/26/23 [phone]

## 2023-10-29 ENCOUNTER — Ambulatory Visit: Payer: Self-pay | Admitting: Nurse Practitioner

## 2023-11-21 ENCOUNTER — Ambulatory Visit: Payer: Self-pay | Admitting: Nurse Practitioner

## 2023-12-12 ENCOUNTER — Other Ambulatory Visit: Payer: Self-pay | Admitting: Nurse Practitioner

## 2023-12-12 DIAGNOSIS — I10 Essential (primary) hypertension: Secondary | ICD-10-CM

## 2023-12-16 ENCOUNTER — Ambulatory Visit (INDEPENDENT_AMBULATORY_CARE_PROVIDER_SITE_OTHER): Payer: Self-pay | Admitting: Nurse Practitioner

## 2023-12-16 ENCOUNTER — Encounter: Payer: Self-pay | Admitting: Nurse Practitioner

## 2023-12-16 VITALS — BP 129/80 | HR 72 | Temp 98.0°F | Wt 261.6 lb

## 2023-12-16 DIAGNOSIS — Z125 Encounter for screening for malignant neoplasm of prostate: Secondary | ICD-10-CM | POA: Diagnosis not present

## 2023-12-16 DIAGNOSIS — I272 Pulmonary hypertension, unspecified: Secondary | ICD-10-CM | POA: Diagnosis not present

## 2023-12-16 DIAGNOSIS — I10 Essential (primary) hypertension: Secondary | ICD-10-CM | POA: Diagnosis not present

## 2023-12-16 NOTE — Patient Instructions (Signed)
 1. Pulmonary hypertension (HCC) (Primary)  Please follow with pulmonary - [facility] in [facility] - [phone] to schedule an appointment.

## 2023-12-16 NOTE — Progress Notes (Signed)
   Subjective   Patient ID: Jacob Freeman, male    DOB: 1971-06-23, 53 y.o.   MRN: [id number]  Chief Complaint  Patient presents with   Medical Management of Chronic Issues    Referring provider: Jerrlyn Morel, NP  Jahquan Klugh is a 53 y.o. male with Past Medical History: No date: Chest pain, unspecified No date: Esophageal reflux No date: Hematuria No date: Hypercholesteremia No date: Hypertension No date: Nephrolithiasis No date: Pain in joint, hand 07/2019: Prediabetes 01/2019: Vitamin D  deficiency 02/2020: Vitamin D  deficiency   HPI  Patient presents today for a ED follow-up.  He was seen in the ED on 08/25/2023 and was diagnosed with pneumonia of the right upper lobe.  CTA did show main pulmonary artery dilation likely reflecting underlying pulmonary arterial hypertension.  We placed a referral to pulmonary for further evaluation, but patient did not return their call.  We will give him their number today so he can call to make that appointment.  Denies f/c/s, n/v/d, hemoptysis, PND, leg swelling Denies chest pain or edema    No Known Allergies  Immunization History  Administered Date(s) Administered   Influenza,inj,Quad PF,6+ Mos 08/26/2018   Tdap 02/05/2019    Tobacco History: Social History   Tobacco Use  Smoking Status Never  Smokeless Tobacco Never   Counseling given: Not Answered   Outpatient Encounter Medications as of 12/16/2023  Medication Sig   acetaminophen  (TYLENOL ) 500 MG tablet Take 500 mg by mouth every 6 (six) hours as needed for mild pain.   amLODipine  (NORVASC ) 10 MG tablet Take 1 tablet by mouth once daily   ondansetron  (ZOFRAN -ODT) 4 MG disintegrating tablet Take 1 tablet (4 mg total) by mouth every 8 (eight) hours as needed for nausea or vomiting.   No facility-administered encounter medications on file as of 12/16/2023.    Review of Systems  Review of Systems  Constitutional: Negative.   HENT: Negative.    Cardiovascular:  Negative.   Gastrointestinal: Negative.   Allergic/Immunologic: Negative.   Neurological: Negative.   Psychiatric/Behavioral: Negative.       Objective:   BP 129/80   Pulse 72   Temp 98 F (36.7 C) (Oral)   Wt 261 lb 9.6 oz (118.7 kg)   SpO2 96%   BMI 36.49 kg/m   Wt Readings from Last 5 Encounters:  12/16/23 261 lb 9.6 oz (118.7 kg)  09/26/23 256 lb (116.1 kg)  09/23/23 258 lb (117 kg)  09/03/23 260 lb 3.2 oz (118 kg)  08/25/23 260 lb (117.9 kg)     Physical Exam Vitals and nursing note reviewed.  Constitutional:      General: He is not in acute distress.    Appearance: He is well-developed.  Cardiovascular:     Rate and Rhythm: Normal rate and regular rhythm.  Pulmonary:     Effort: Pulmonary effort is normal.     Breath sounds: Normal breath sounds.  Skin:    General: Skin is warm and dry.  Neurological:     Mental Status: He is alert and oriented to person, place, and time.       Assessment & Plan:   Pulmonary hypertension (HCC)  Prostate cancer screening -     PSA  Essential hypertension -     CBC -     Comprehensive metabolic panel with GFR     Return in about 6 months (around 06/17/2024).   Jerrlyn Morel, NP 12/16/2023

## 2023-12-17 LAB — COMPREHENSIVE METABOLIC PANEL WITH GFR
ALT: 18 IU/L (ref 0–44)
AST: 14 IU/L (ref 0–40)
Albumin: 3.8 g/dL (ref 3.8–4.9)
Alkaline Phosphatase: 64 IU/L (ref 44–121)
BUN/Creatinine Ratio: 7 — ABNORMAL LOW (ref 9–20)
BUN: 8 mg/dL (ref 6–24)
Bilirubin Total: 0.5 mg/dL (ref 0.0–1.2)
CO2: 23 mmol/L (ref 20–29)
Calcium: 8.9 mg/dL (ref 8.7–10.2)
Chloride: 105 mmol/L (ref 96–106)
Creatinine, Ser: 1.14 mg/dL (ref 0.76–1.27)
Globulin, Total: 1.8 g/dL (ref 1.5–4.5)
Glucose: 103 mg/dL — ABNORMAL HIGH (ref 70–99)
Potassium: 4.3 mmol/L (ref 3.5–5.2)
Sodium: 142 mmol/L (ref 134–144)
Total Protein: 5.6 g/dL — ABNORMAL LOW (ref 6.0–8.5)
eGFR: 77 mL/min/{1.73_m2} (ref >59)

## 2023-12-17 LAB — CBC
Hematocrit: 46.9 % (ref 37.5–51.0)
Hemoglobin: 14.9 g/dL (ref 13.0–17.7)
MCH: 26.3 pg — ABNORMAL LOW (ref 26.6–33.0)
MCHC: 31.8 g/dL (ref 31.5–35.7)
MCV: 83 fL (ref 79–97)
Platelets: 249 10*3/uL (ref 150–450)
RBC: 5.66 x10E6/uL (ref 4.14–5.80)
RDW: 14.9 % (ref 11.6–15.4)
WBC: 4.6 10*3/uL (ref 3.4–10.8)

## 2023-12-17 LAB — PSA: Prostate Specific Ag, Serum: 0.7 ng/mL (ref 0.0–4.0)

## 2023-12-18 ENCOUNTER — Ambulatory Visit: Payer: Self-pay | Admitting: Nurse Practitioner

## 2024-03-25 ENCOUNTER — Other Ambulatory Visit: Payer: Self-pay | Admitting: Nurse Practitioner

## 2024-03-25 DIAGNOSIS — I10 Essential (primary) hypertension: Secondary | ICD-10-CM

## 2024-06-15 ENCOUNTER — Emergency Department (HOSPITAL_COMMUNITY): Payer: Worker's Compensation

## 2024-06-15 ENCOUNTER — Emergency Department (HOSPITAL_COMMUNITY)
Admission: EM | Admit: 2024-06-15 | Discharge: 2024-06-15 | Disposition: A | Discharge status: Home / Self Care | Payer: Worker's Compensation | Attending: Emergency Medicine | Admitting: Emergency Medicine

## 2024-06-15 DIAGNOSIS — Y9301 Activity, walking, marching and hiking: Secondary | ICD-10-CM | POA: Insufficient documentation

## 2024-06-15 DIAGNOSIS — Y990 Civilian activity done for income or pay: Secondary | ICD-10-CM | POA: Insufficient documentation

## 2024-06-15 DIAGNOSIS — M25572 Pain in left ankle and joints of left foot: Secondary | ICD-10-CM | POA: Diagnosis present

## 2024-06-15 DIAGNOSIS — I10 Essential (primary) hypertension: Secondary | ICD-10-CM | POA: Insufficient documentation

## 2024-06-15 DIAGNOSIS — X501XXA Overexertion from prolonged static or awkward postures, initial encounter: Secondary | ICD-10-CM | POA: Diagnosis not present

## 2024-06-15 DIAGNOSIS — Y92480 Sidewalk as the place of occurrence of the external cause: Secondary | ICD-10-CM | POA: Diagnosis not present

## 2024-06-15 DIAGNOSIS — Z79899 Other long term (current) drug therapy: Secondary | ICD-10-CM | POA: Insufficient documentation

## 2024-06-15 MED ORDER — IBUPROFEN 800 MG PO TABS
800.0000 mg | ORAL_TABLET | Freq: Once | ORAL | Status: AC
  Administered 2024-06-15: 800 mg via ORAL
  Filled 2024-06-15: qty 1

## 2024-06-15 NOTE — Progress Notes (Signed)
 Orthopedic Tech Progress Note Patient Details:  Jacob Freeman [birth year] [id number]  Ortho Devices Type of Ortho Device: CAM walker, Crutches Ortho Device/Splint Location: left cam boot applied. crutches sized and instructed on use Ortho Device/Splint Interventions: Ordered, Application, Adjustment   Post Interventions Patient Tolerated: Well, Ambulated well Instructions Provided: Adjustment of device, Care of device  Jacob Freeman 06/15/2024, 8:57 AM

## 2024-06-15 NOTE — ED Triage Notes (Signed)
 Pt arrives via [facility] from work after rolling his L ankle while stepping off a curb. Pt reports difficulty ambulating thereafter. No fall, head injury, LOC reported by pt.

## 2024-06-15 NOTE — ED Provider Notes (Signed)
 [place] EMERGENCY DEPARTMENT AT [facility] Provider Note   CSN: [id number] Arrival date & time: 06/15/24  9292     Patient presents with: Ankle Pain  HPI Jacob Freeman is a 53 y.o. male presenting for left ankle pain.  Occurred around 630 this morning.  He was walking into work when he slipped on the curb of the sidewalk and rolled his left ankle inward.  He reports immediate pain about the outer part of his ankle joint along with swelling.  States he is not able to walk on it at this time.  Denies any open wounds around the ankle.  Past Medical History:  Diagnosis Date   Chest pain, unspecified    Esophageal reflux    Hematuria    Hypercholesteremia    Hypertension    Nephrolithiasis    Pain in joint, hand    Prediabetes 07/2019   Vitamin D  deficiency 01/2019   Vitamin D  deficiency 02/2020       Ankle Pain      Prior to Admission medications   Medication Sig Start Date End Date Taking? Authorizing Provider  acetaminophen  (TYLENOL ) 500 MG tablet Take 500 mg by mouth every 6 (six) hours as needed for mild pain.    [provider]  amLODipine  (NORVASC ) 10 MG tablet Take 1 tablet by mouth once daily 03/25/24   Nichols, Tonya S, NP  ondansetron  (ZOFRAN -ODT) 4 MG disintegrating tablet Take 1 tablet (4 mg total) by mouth every 8 (eight) hours as needed for nausea or vomiting. 09/26/23   Dreama, [place]  N, FNP    Allergies: Patient has no known allergies.    Review of Systems  Updated Vital Signs BP (!) 163/95 (BP Location: Left Arm)   Pulse 74   Temp 98 F (36.7 C) (Oral)   Resp 16   SpO2 99%   Physical Exam Constitutional:      Appearance: Normal appearance.  HENT:     Head: Normocephalic.     Nose: Nose normal.  Eyes:     Conjunctiva/sclera: Conjunctivae normal.  Pulmonary:     Effort: Pulmonary effort is normal.  Musculoskeletal:     Left ankle: Swelling present. No deformity, ecchymosis or lacerations. Tenderness present over the  lateral malleolus. Decreased range of motion. Normal pulse.     Comments: Having difficulty bearing weight with ambulation  Neurological:     Mental Status: He is alert.  Psychiatric:        Mood and Affect: Mood normal.     (all labs ordered are listed, but only abnormal results are displayed) Labs Reviewed - No data to display  EKG: None  Radiology: DG Ankle Complete Left Result Date: 06/15/2024 CLINICAL DATA:  fall EXAM: LEFT ANKLE COMPLETE - 3+ VIEW COMPARISON:  None Available. FINDINGS: No acute fracture or dislocation. Joint spaces and alignment are maintained. No area of erosion or osseous destruction. No unexpected radiopaque foreign body. Soft tissue edema with a likely ankle effusion. IMPRESSION: 1. No definitive acute fracture or dislocation. 2. Soft tissue edema with a likely ankle effusion. Electronically Signed   By: Corean Salter M.D.   On: 06/15/2024 08:03     Procedures   Medications Ordered in the ED  ibuprofen  (ADVIL ) tablet 800 mg (800 mg Oral Given 06/15/24 0731)  Medical Decision Making Risk Prescription drug management.   53 year old male presenting for left ankle pain.  Exam notable for swelling and tenderness primarily about the lateral left malleolus.  Neurovascularly intact. Also patient unable to bear weight.  X-ray showing no acute osseous abnormalities but there is soft tissue edema in that area as well as likely ankle effusion.  Applied cam boot and given crutches and recommended nonweightbearing activities until he is evaluated by foot and ankle.  Otherwise advised Tylenol  and ibuprofen  for pain and RICE treatment at home. Discharge.     Final diagnoses:  Acute left ankle pain    ED Discharge Orders     None          Lang Norleen POUR, PA-C 06/15/24 0840    Cottie Donnice PARAS, MD 06/15/24 1530

## 2024-06-15 NOTE — Discharge Instructions (Addendum)
 Evaluation today revealed that you likely have an ankle sprain.  Given that you are not able to bear weight on that ankle would recommend that you follow-up with a foot and ankle specialist.  I would call them and schedule appointment today.  In the meantime recommended nonweightbearing activities for now.  You can take Tylenol  and ibuprofen  at home for pain.  You can take the cam boot off and ice your ankle 3-4 times a day as well.

## 2024-06-17 ENCOUNTER — Encounter: Payer: Self-pay | Admitting: Nurse Practitioner

## 2024-06-17 ENCOUNTER — Ambulatory Visit (INDEPENDENT_AMBULATORY_CARE_PROVIDER_SITE_OTHER): Payer: Self-pay | Admitting: Nurse Practitioner

## 2024-06-17 VITALS — BP 137/76 | HR 73 | Wt 278.0 lb

## 2024-06-17 DIAGNOSIS — Z23 Encounter for immunization: Secondary | ICD-10-CM | POA: Diagnosis not present

## 2024-06-17 DIAGNOSIS — I10 Essential (primary) hypertension: Secondary | ICD-10-CM | POA: Diagnosis not present

## 2024-06-17 DIAGNOSIS — E785 Hyperlipidemia, unspecified: Secondary | ICD-10-CM

## 2024-06-17 MED ORDER — AMLODIPINE BESYLATE 10 MG PO TABS: 10.0000 mg | ORAL_TABLET | Freq: Every day | ORAL | 0 refills | Status: AC

## 2024-06-17 NOTE — Progress Notes (Signed)
 Subjective   Patient ID: Jacob Freeman, male    DOB: 09-26-1970, 53 y.o.   MRN: [id number]  Chief Complaint  Patient presents with   Medical Management of Chronic Issues   Hypertension    Last dose about two hours ago . Has not missed any doses.   Flu Vaccine    Given     Referring provider: Oley Bascom RAMAN, NP  Jacob Freeman is a 53 y.o. male with Past Medical History: No date: Chest pain, unspecified No date: Esophageal reflux No date: Hematuria No date: Hypercholesteremia No date: Hypertension No date: Nephrolithiasis No date: Pain in joint, hand 07/2019: Prediabetes 01/2019: Vitamin D  deficiency 02/2020: Vitamin D  deficiency   HPI  Patient presents today for a follow-up on hypertension.  Overall he has been doing well.  He does need labs today.  He has not followed with pulmonary for pulmonary hypertension.  We have given him the number to pulmonary in office today so he can call make appointment. Denies f/c/s, n/v/d, hemoptysis, PND, leg swelling Denies chest pain or edema     No Known Allergies  Immunization History  Administered Date(s) Administered   Influenza, Seasonal, Injecte, Preservative Fre 06/17/2024   Influenza,inj,Quad PF,6+ Mos 08/26/2018   Tdap 02/05/2019    Tobacco History: Social History   Tobacco Use  Smoking Status Never  Smokeless Tobacco Never   Counseling given: Not Answered   Outpatient Encounter Medications as of 06/17/2024  Medication Sig   acetaminophen  (TYLENOL ) 500 MG tablet Take 500 mg by mouth every 6 (six) hours as needed for mild pain.   Cyanocobalamin 1000 MCG SUBL Place under the tongue.   ondansetron  (ZOFRAN -ODT) 4 MG disintegrating tablet Take 1 tablet (4 mg total) by mouth every 8 (eight) hours as needed for nausea or vomiting.   [DISCONTINUED] amLODipine  (NORVASC ) 10 MG tablet Take 1 tablet by mouth once daily   amLODipine  (NORVASC ) 10 MG tablet Take 1 tablet (10 mg total) by mouth daily.   No  facility-administered encounter medications on file as of 06/17/2024.    Review of Systems  Review of Systems  Constitutional: Negative.   HENT: Negative.    Cardiovascular: Negative.   Gastrointestinal: Negative.   Allergic/Immunologic: Negative.   Neurological: Negative.   Psychiatric/Behavioral: Negative.       Objective:   BP 137/76   Pulse 73   Wt 278 lb (126.1 kg)   SpO2 97%   BMI 38.77 kg/m   Wt Readings from Last 5 Encounters:  06/17/24 278 lb (126.1 kg)  12/16/23 261 lb 9.6 oz (118.7 kg)  09/26/23 256 lb (116.1 kg)  09/23/23 258 lb (117 kg)  09/03/23 260 lb 3.2 oz (118 kg)     Physical Exam Vitals and nursing note reviewed.  Constitutional:      General: He is not in acute distress.    Appearance: He is well-developed.  Cardiovascular:     Rate and Rhythm: Normal rate and regular rhythm.  Pulmonary:     Effort: Pulmonary effort is normal.     Breath sounds: Normal breath sounds.  Skin:    General: Skin is warm and dry.  Neurological:     Mental Status: He is alert and oriented to person, place, and time.       Assessment & Plan:   Essential hypertension -     amLODIPine  Besylate; Take 1 tablet (10 mg total) by mouth daily.  Dispense: 90 tablet; Refill: 0  Need for influenza vaccination -  Flu vaccine trivalent PF, 6mos and older(Flulaval,Afluria,Fluarix,Fluzone)  Hyperlipidemia, unspecified hyperlipidemia type -     Lipid panel     Return in about 6 months (around 12/15/2024).   Bascom GORMAN Borer, NP 06/17/2024

## 2024-06-17 NOTE — Patient Instructions (Signed)
 [facility] in [place]. We receive a referral   Please call [phone] to schedule an appointment.

## 2024-06-18 ENCOUNTER — Ambulatory Visit: Payer: Self-pay | Admitting: Nurse Practitioner

## 2024-06-18 LAB — LIPID PANEL
Chol/HDL Ratio: 3.8 ratio (ref 0.0–5.0)
Cholesterol, Total: 207 mg/dL — ABNORMAL HIGH (ref 100–199)
HDL: 54 mg/dL (ref >39)
LDL Chol Calc (NIH): 142 mg/dL — ABNORMAL HIGH (ref 0–99)
Triglycerides: 63 mg/dL (ref 0–149)
VLDL Cholesterol Cal: 11 mg/dL (ref 5–40)
